# Patient Record
Sex: Male | Born: 1990 | Race: Black or African American | Hispanic: No | Marital: Single | Smoking: Current every day smoker
Health system: Southern US, Community
[De-identification: ages and names within clinical notes are randomized; demographics above are authoritative.]

## PROBLEM LIST (undated history)

## (undated) DIAGNOSIS — K259 Gastric ulcer, unspecified as acute or chronic, without hemorrhage or perforation: Secondary | ICD-10-CM

## (undated) DIAGNOSIS — M5030 Other cervical disc degeneration, unspecified cervical region: Secondary | ICD-10-CM

## (undated) DIAGNOSIS — F909 Attention-deficit hyperactivity disorder, unspecified type: Secondary | ICD-10-CM

## (undated) DIAGNOSIS — M5020 Other cervical disc displacement, unspecified cervical region: Secondary | ICD-10-CM

## (undated) HISTORY — DX: Gastric ulcer, unspecified as acute or chronic, without hemorrhage or perforation: K25.9

---
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
## 2011-09-22 LAB — RAPID STREP SCREEN (MED CTR MEBANE ONLY): Streptococcus, Group A Screen (Direct): NEGATIVE

## 2011-11-25 ENCOUNTER — Encounter: Payer: Self-pay | Admitting: *Deleted

## 2011-11-25 ENCOUNTER — Emergency Department (HOSPITAL_COMMUNITY)
Admission: EM | Admit: 2011-11-25 | Discharge: 2011-11-26 | Disposition: A | Discharge status: Home / Self Care | Payer: Worker's Compensation | Attending: Emergency Medicine | Admitting: Emergency Medicine

## 2011-11-25 ENCOUNTER — Emergency Department (HOSPITAL_COMMUNITY): Payer: Worker's Compensation

## 2011-11-25 DIAGNOSIS — R059 Cough, unspecified: Secondary | ICD-10-CM | POA: Insufficient documentation

## 2011-11-25 DIAGNOSIS — R0602 Shortness of breath: Secondary | ICD-10-CM | POA: Insufficient documentation

## 2011-11-25 DIAGNOSIS — R071 Chest pain on breathing: Secondary | ICD-10-CM | POA: Insufficient documentation

## 2011-11-25 DIAGNOSIS — R05 Cough: Secondary | ICD-10-CM | POA: Insufficient documentation

## 2011-11-25 DIAGNOSIS — R0789 Other chest pain: Secondary | ICD-10-CM

## 2011-11-25 NOTE — ED Notes (Signed)
C/o sickness onset about 2 weeks ago, gradually progressively worse, not getting better, c/o sob, pain in chest, cough (dry/non-productive) & HA. (denies: dizziness,nvd,congestion, sore throat, earaches, facial pain or body aches), mentions occaisional back pain. Pinpoints pain to sternal area, 6/10, fluctuates & varies, worse when he steps out into the cold.poor inspiritory effort, pain worse with inspiration, LS CTA, shallow, CP worse with cough, inspiration, movement and talking.

## 2011-11-26 ENCOUNTER — Other Ambulatory Visit: Payer: Self-pay

## 2011-11-26 MED ORDER — IBUPROFEN 800 MG PO TABS: 800.0000 mg | ORAL_TABLET | Freq: Three times a day (TID) | ORAL | Status: AC

## 2011-11-26 MED ORDER — KETOROLAC TROMETHAMINE 60 MG/2ML IM SOLN
60.0000 mg | Freq: Once | INTRAMUSCULAR | Status: AC
  Administered 2011-11-26: 60 mg via INTRAMUSCULAR
  Filled 2011-11-26: qty 2

## 2011-11-26 NOTE — ED Provider Notes (Signed)
History     CSN: [id number] Arrival date & time: 11/25/2011 10:14 PM   First MD Initiated Contact with Patient 11/26/11 0116      Chief Complaint  Patient presents with  . Shortness of Breath    (Consider location/radiation/quality/duration/timing/severity/associated sxs/prior treatment) Patient is a 20 y.o. male presenting with chest pain. The history is provided by the patient.  Chest Pain The chest pain began 2 days ago. Duration of episode(s) is 2 days. Chest pain occurs frequently. The chest pain is unchanged. The pain is associated with breathing and coughing. The severity of the pain is moderate. The quality of the pain is described as sharp. The pain does not radiate. Chest pain is worsened by certain positions and deep breathing. Pertinent negatives for primary symptoms include no fever, no fatigue, no syncope, no shortness of breath, no wheezing, no palpitations, no abdominal pain, no nausea, no vomiting, no dizziness and no altered mental status.  Pertinent negatives for associated symptoms include no diaphoresis. He tried nothing for the symptoms. Risk factors include no known risk factors.  Pertinent negatives for past medical history include no CAD.    patient had URI symptoms about 2 weeks ago. His cough is now improving and symptoms are improving overall but he has developed substernal sharp chest pain. Hurts take a deep breath hurts to touch the area. No alleviating factors. No history of same. No leg pain or swelling or history of DVT or PE. No recent travel or surgery.  History reviewed. No pertinent past medical history.  History reviewed. No pertinent past surgical history.  Family History  Problem Relation Age of Onset  . Diabetes Other   . Heart failure Other     History  Substance Use Topics  . Smoking status: Current Everyday Smoker    Types: Cigarettes  . Smokeless tobacco: Not on file  . Alcohol Use: No      Review of Systems  Constitutional:  Negative for fever, chills, diaphoresis and fatigue.  HENT: Negative for neck pain and neck stiffness.   Eyes: Negative for pain.  Respiratory: Negative for shortness of breath and wheezing.   Cardiovascular: Positive for chest pain. Negative for palpitations and syncope.  Gastrointestinal: Negative for nausea, vomiting and abdominal pain.  Genitourinary: Negative for dysuria.  Musculoskeletal: Negative for back pain.  Skin: Negative for rash.  Neurological: Negative for dizziness and headaches.  Psychiatric/Behavioral: Negative for altered mental status.  All other systems reviewed and are negative.    Allergies  Review of patient's allergies indicates no known allergies.  Home Medications  No current outpatient prescriptions on file.  BP 121/63  Pulse 78  Temp(Src) 98 F (36.7 C) (Oral)  Resp 20  SpO2 97%  Physical Exam  Constitutional: He is oriented to person, place, and time. He appears well-developed and well-nourished.  HENT:  Head: Normocephalic and atraumatic.  Eyes: Conjunctivae and EOM are normal. Pupils are equal, round, and reactive to light.  Neck: Trachea normal. Neck supple. No thyromegaly present.  Cardiovascular: Normal rate, regular rhythm, S1 normal, S2 normal and normal pulses.     No systolic murmur is present   No diastolic murmur is present  Pulses:      Radial pulses are 2+ on the right side, and 2+ on the left side.  Pulmonary/Chest: Effort normal and breath sounds normal. He has no wheezes. He has no rhonchi. He has no rales.       Reproducible anterior chest wall tenderness over sternum. No  crepitus and no rash.  Abdominal: Soft. Normal appearance and bowel sounds are normal. There is no tenderness. There is no CVA tenderness and negative Murphy's sign.  Musculoskeletal:       BLE:s Calves nontender, no cords or erythema, negative Homans sign  Neurological: He is alert and oriented to person, place, and time. He has normal strength. No cranial  nerve deficit or sensory deficit. GCS eye subscore is 4. GCS verbal subscore is 5. GCS motor subscore is 6.  Skin: Skin is warm and dry. No rash noted. He is not diaphoretic.  Psychiatric: His speech is normal.       Cooperative and appropriate    ED Course  Procedures (including critical care time)  Labs Reviewed - No data to display Dg Chest 2 View  11/25/2011  *RADIOLOGY REPORT*  Clinical Data: Of breath  CHEST - 2 VIEW  Comparison: None.  Findings: The heart, mediastinal, and hilar contours are normal. The lungs are well-expanded and clear. Negative for pleural effusion. The bony thorax is unremarkable.  IMPRESSION: No acute cardiopulmonary disease  Original Report Authenticated By: Britta Mccreedy, M.D.    Date: 11/26/2011  Rate: 62  Rhythm: normal sinus rhythm  QRS Axis: normal  Intervals: normal  ST/T Wave abnormalities: nonspecific ST changes  Conduction Disutrbances:none  Narrative Interpretation:   Old EKG Reviewed: none available    MDM   Chest wall pain reproducible tenderness in the setting of post viral syndrome. Chest x-ray and EKG. Anti-inflammatories provided with IM medication and plan outpatient management of symptoms.        Sunnie Nielsen, MD 11/26/11 [phone]

## 2011-11-26 NOTE — ED Notes (Signed)
Pt to ED c/o increased sob and sternal pain.  He began with a fever 2 weeks prior and some chest congestion.  Since then he has not experienced coughing, but has increasing sob, so much so that he feels he can't finish his sentences.    Presently, pt does not appear in respiratory distress.  Lung sounds clear bil, equal expansion of lungs.

## 2011-11-26 NOTE — ED Notes (Signed)
Pt states pain decreased to 2/10 after ketorolac.

## 2012-02-18 ENCOUNTER — Emergency Department (INDEPENDENT_AMBULATORY_CARE_PROVIDER_SITE_OTHER): Payer: Worker's Compensation

## 2012-02-18 ENCOUNTER — Encounter (HOSPITAL_COMMUNITY): Payer: Self-pay

## 2012-02-18 ENCOUNTER — Emergency Department (INDEPENDENT_AMBULATORY_CARE_PROVIDER_SITE_OTHER)
Admission: EM | Admit: 2012-02-18 | Discharge: 2012-02-18 | Disposition: A | Discharge status: Home / Self Care | Payer: Worker's Compensation | Source: Home / Self Care | Attending: Emergency Medicine | Admitting: Emergency Medicine

## 2012-02-18 DIAGNOSIS — S9030XA Contusion of unspecified foot, initial encounter: Secondary | ICD-10-CM

## 2012-02-18 MED ORDER — IBUPROFEN 800 MG PO TABS: 800.0000 mg | ORAL_TABLET | Freq: Three times a day (TID) | ORAL | Status: AC
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
## 2012-02-18 NOTE — Discharge Instructions (Signed)
Elevate and apply ice intermittently. Follow up as needed. Will be sore for a few days.
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
## 2012-10-23 ENCOUNTER — Encounter (HOSPITAL_COMMUNITY): Payer: Self-pay | Admitting: *Deleted

## 2012-10-23 ENCOUNTER — Emergency Department (HOSPITAL_COMMUNITY)
Admission: EM | Admit: 2012-10-23 | Discharge: 2012-10-23 | Disposition: A | Discharge status: Home / Self Care | Payer: Self-pay | Attending: Emergency Medicine | Admitting: Emergency Medicine

## 2012-10-23 DIAGNOSIS — H109 Unspecified conjunctivitis: Secondary | ICD-10-CM | POA: Insufficient documentation

## 2012-10-23 DIAGNOSIS — H5710 Ocular pain, unspecified eye: Secondary | ICD-10-CM | POA: Insufficient documentation

## 2012-10-23 DIAGNOSIS — F17200 Nicotine dependence, unspecified, uncomplicated: Secondary | ICD-10-CM | POA: Insufficient documentation

## 2012-10-23 MED ORDER — TOBRAMYCIN 0.3 % OP SOLN: 1.0000 [drp] | OPHTHALMIC | Status: DC

## 2012-10-23 NOTE — ED Provider Notes (Signed)
History     CSN: [id number]  Arrival date & time 10/23/12  2120   First MD Initiated Contact with Patient 10/23/12 2135      Chief Complaint  Patient presents with  . Eye Drainage    (Consider location/radiation/quality/duration/timing/severity/associated sxs/prior treatment) HPI Comments: 21 year old male presents to the emergency department complaining of irritation and redness to both of his eyes. The irritation began 3 days ago on the right side, and yesterday the left eye began getting the same symptoms. States he wakes up in the morning with his eyes crusted together. Admits to thick clear discharge. His eyes are not itchy, just irritated. No contacts with similar symptoms. He has not been sick recently. He is not complaining of any other issues at this time. Denies getting any foreign body in his eye or chemical exposure. Denies any visual disturbance.  The history is provided by the patient.    History reviewed. No pertinent past medical history.  History reviewed. No pertinent past surgical history.  Family History  Problem Relation Age of Onset  . Diabetes Other   . Heart failure Other     History  Substance Use Topics  . Smoking status: Current Every Day Smoker    Types: Cigarettes  . Smokeless tobacco: Not on file  . Alcohol Use: No      Review of Systems  Constitutional: Negative for fever and chills.  HENT: Negative for congestion and sneezing.   Eyes: Positive for pain, discharge and redness. Negative for itching and visual disturbance.  Respiratory: Negative for cough and shortness of breath.   Cardiovascular: Negative for chest pain.  Musculoskeletal: Negative for arthralgias.  Neurological: Negative for headaches.    Allergies  Review of patient's allergies indicates no known allergies.  Home Medications   Current Outpatient Rx  Name Route Sig Dispense Refill  . ACETAMINOPHEN 500 MG PO TABS Oral Take 1,000 mg by mouth every 6 (six) hours as  needed. Pain    . IBUPROFEN 200 MG PO TABS Oral Take 400 mg by mouth every 6 (six) hours as needed. Pain    . TOBRAMYCIN SULFATE 0.3 % OP SOLN Both Eyes Place 1 drop into both eyes every 4 (four) hours. 5 mL 0    BP 141/86  Pulse 80  Temp 98.4 F (36.9 C)  Resp 20  SpO2 100%  Physical Exam  Constitutional: He is oriented to person, place, and time. He appears well-developed and well-nourished. No distress.  HENT:  Head: Normocephalic and atraumatic.  Mouth/Throat: Oropharynx is clear and moist. No oropharyngeal exudate.  Eyes: EOM are normal. Pupils are equal, round, and reactive to light. Right eye exhibits discharge (thick yellow). No foreign body present in the right eye. Left eye exhibits discharge (thick yellow). No foreign body present in the left eye. Right conjunctiva is injected. Left conjunctiva is injected.  Neck: Normal range of motion.  Cardiovascular: Normal rate, regular rhythm and normal heart sounds.   Pulmonary/Chest: Effort normal and breath sounds normal.  Musculoskeletal: Normal range of motion. He exhibits no edema.  Lymphadenopathy:    He has no cervical adenopathy.  Neurological: He is alert and oriented to person, place, and time.  Skin: Skin is warm and dry. No rash noted.  Psychiatric: He has a normal mood and affect. His behavior is normal.    ED Course  Procedures (including critical care time)  Labs Reviewed - No data to display No results found.   1. Bacterial conjunctivitis  MDM  Thick yellow discharge consistent with bacterial conjunctivitis. Prescribed Tobrex eye drops. Advised warm compresses. No visual disturbance or foreign body.       Trevor Mace, PA-C 10/23/12 2207

## 2012-10-23 NOTE — ED Notes (Signed)
Pt c/o eyes red, draining, painful x 3 days

## 2012-10-24 NOTE — ED Provider Notes (Signed)
Medical screening examination/treatment/procedure(s) were performed by non-physician practitioner and as supervising physician I was immediately available for consultation/collaboration.   Lyanne Co, MD 10/24/12 [phone]

## 2013-05-23 ENCOUNTER — Emergency Department (HOSPITAL_COMMUNITY)
Admission: EM | Admit: 2013-05-23 | Discharge: 2013-05-23 | Disposition: A | Discharge status: Home / Self Care | Payer: Self-pay | Attending: Emergency Medicine | Admitting: Emergency Medicine

## 2013-05-23 ENCOUNTER — Encounter (HOSPITAL_COMMUNITY): Payer: Self-pay

## 2013-05-23 DIAGNOSIS — R630 Anorexia: Secondary | ICD-10-CM | POA: Insufficient documentation

## 2013-05-23 DIAGNOSIS — R110 Nausea: Secondary | ICD-10-CM | POA: Insufficient documentation

## 2013-05-23 DIAGNOSIS — J029 Acute pharyngitis, unspecified: Secondary | ICD-10-CM | POA: Insufficient documentation

## 2013-05-23 DIAGNOSIS — K2990 Gastroduodenitis, unspecified, without bleeding: Secondary | ICD-10-CM | POA: Insufficient documentation

## 2013-05-23 DIAGNOSIS — F17200 Nicotine dependence, unspecified, uncomplicated: Secondary | ICD-10-CM | POA: Insufficient documentation

## 2013-05-23 DIAGNOSIS — K2970 Gastritis, unspecified, without bleeding: Secondary | ICD-10-CM | POA: Insufficient documentation

## 2013-05-23 LAB — CBC WITH DIFFERENTIAL/PLATELET
Basophils Relative: 0 % (ref 0–1)
HCT: 39.1 % (ref 39.0–52.0)
Hemoglobin: 12.9 g/dL — ABNORMAL LOW (ref 13.0–17.0)
Lymphs Abs: 2.6 10*3/uL (ref 0.7–4.0)
MCH: 26.8 pg (ref 26.0–34.0)
MCHC: 33 g/dL (ref 30.0–36.0)
Monocytes Absolute: 0.9 10*3/uL (ref 0.1–1.0)
Monocytes Relative: 8 % (ref 3–12)
Neutro Abs: 6.6 10*3/uL (ref 1.7–7.7)
Neutrophils Relative %: 65 % (ref 43–77)
RBC: 4.81 MIL/uL (ref 4.22–5.81)

## 2013-05-23 LAB — COMPREHENSIVE METABOLIC PANEL
Alkaline Phosphatase: 69 U/L (ref 39–117)
BUN: 8 mg/dL (ref 6–23)
Chloride: 102 mEq/L (ref 96–112)
Creatinine, Ser: 1 mg/dL (ref 0.50–1.35)
GFR calc Af Amer: >90 mL/min (ref >90)
GFR calc non Af Amer: >90 mL/min (ref >90)
Glucose, Bld: 95 mg/dL (ref 70–99)
Potassium: 3.8 mEq/L (ref 3.5–5.1)
Total Bilirubin: 0.3 mg/dL (ref 0.3–1.2)

## 2013-05-23 MED ORDER — FAMOTIDINE 20 MG PO TABS: 20.0000 mg | ORAL_TABLET | Freq: Two times a day (BID) | ORAL | Status: DC

## 2013-05-23 MED ORDER — PANTOPRAZOLE SODIUM 20 MG PO TBEC: 20.0000 mg | DELAYED_RELEASE_TABLET | Freq: Every day | ORAL | Status: DC

## 2013-05-23 MED ORDER — GI COCKTAIL ~~LOC~~
30.0000 mL | Freq: Once | ORAL | Status: AC
  Administered 2013-05-23: 30 mL via ORAL
  Filled 2013-05-23: qty 30

## 2013-05-23 MED ORDER — PROMETHAZINE HCL 25 MG PO TABS: 25.0000 mg | ORAL_TABLET | Freq: Four times a day (QID) | ORAL | Status: DC | PRN

## 2013-05-23 NOTE — ED Provider Notes (Signed)
Medical screening examination/treatment/procedure(s) were performed by non-physician practitioner and as supervising physician I was immediately available for consultation/collaboration.  Sunnie Nielsen, MD 05/23/13 [phone]

## 2013-05-23 NOTE — ED Provider Notes (Signed)
History     CSN: [id number]  Arrival date & time 05/23/13  0008   First MD Initiated Contact with Patient 05/23/13 0045      Chief Complaint  Patient presents with  . Abdominal Pain   HPI  History provided by the patient. The patient is a 22 year old male with no significant PMH who presents with complaints of abdominal pains discomfort. Patient states symptoms first began 3-4 days ago. Symptoms were intermittent and primarily located in the upper abdomen and epigastric area. Pain occasionally radiates up into the chest. He also reports having some pain and soreness to his throat area. Pain in the throat is worse with swallowing. Denies any associated fever, chills or sweats. No nausea vomiting symptoms. He does report decreased appetite and states he generally has to force himself to eat. No diarrhea or constipation. Denies similar symptoms previously. No other aggravating or alleviating factors. No other associated symptoms.    History reviewed. No pertinent past medical history.  History reviewed. No pertinent past surgical history.  Family History  Problem Relation Age of Onset  . Diabetes Other   . Heart failure Other     History  Substance Use Topics  . Smoking status: Current Every Day Smoker    Types: Cigarettes  . Smokeless tobacco: Not on file  . Alcohol Use: No      Review of Systems  Constitutional: Positive for appetite change.  HENT: Positive for sore throat.   Respiratory: Negative for cough and shortness of breath.   Cardiovascular: Negative for chest pain.  Gastrointestinal: Positive for nausea and abdominal pain.  All other systems reviewed and are negative.    Allergies  Review of patient's allergies indicates no known allergies.  Home Medications  No current outpatient prescriptions on file.  BP 137/64  Pulse 77  Temp(Src) 98.6 F (37 C) (Oral)  Resp 18  SpO2 97%  Physical Exam  Nursing note and vitals reviewed. Constitutional: He is  oriented to person, place, and time. He appears well-developed and well-nourished. No distress.  HENT:  Head: Normocephalic.  Pharynx is erythematous with slightly enlarged tonsils and exudate on the right tonsil. Uvula midline. No signs concerning for PTA  Neck: Normal range of motion.  Cardiovascular: Normal rate and regular rhythm.   Pulmonary/Chest: Effort normal and breath sounds normal. No respiratory distress. He has no wheezes. He has no rales.  Abdominal: Soft. There is tenderness in the epigastric area. There is no rebound, no guarding and no tenderness at McBurney's point.  Primarily upper abdominal discomfort. Mild diffuse tenderness. Abdomen soft. No peritoneal signs.  Musculoskeletal: Normal range of motion.  Lymphadenopathy:    He has no cervical adenopathy.  Neurological: He is alert and oriented to person, place, and time.  Skin: Skin is warm.  Psychiatric: He has a normal mood and affect. His behavior is normal.    ED Course  Procedures   Results for orders placed during the hospital encounter of 05/23/13  RAPID STREP SCREEN      Result Value Range   Streptococcus, Group A Screen (Direct) NEGATIVE  NEGATIVE  CBC WITH DIFFERENTIAL      Result Value Range   WBC 10.2  4.0 - 10.5 K/uL   RBC 4.81  4.22 - 5.81 MIL/uL   Hemoglobin 12.9 (*) 13.0 - 17.0 g/dL   HCT 14.7  82.9 - 56.2 %   MCV 81.3  78.0 - 100.0 fL   MCH 26.8  26.0 - 34.0 pg  MCHC 33.0  30.0 - 36.0 g/dL   RDW 14.7  82.9 - 56.2 %   Platelets 178  150 - 400 K/uL   Neutrophils Relative % 65  43 - 77 %   Neutro Abs 6.6  1.7 - 7.7 K/uL   Lymphocytes Relative 25  12 - 46 %   Lymphs Abs 2.6  0.7 - 4.0 K/uL   Monocytes Relative 8  3 - 12 %   Monocytes Absolute 0.9  0.1 - 1.0 K/uL   Eosinophils Relative 1  0 - 5 %   Eosinophils Absolute 0.1  0.0 - 0.7 K/uL   Basophils Relative 0  0 - 1 %   Basophils Absolute 0.0  0.0 - 0.1 K/uL  COMPREHENSIVE METABOLIC PANEL      Result Value Range   Sodium 137  135 - 145  mEq/L   Potassium 3.8  3.5 - 5.1 mEq/L   Chloride 102  96 - 112 mEq/L   CO2 28  19 - 32 mEq/L   Glucose, Bld 95  70 - 99 mg/dL   BUN 8  6 - 23 mg/dL   Creatinine, Ser 1.30  0.50 - 1.35 mg/dL   Calcium 9.4  8.4 - 86.5 mg/dL   Total Protein 7.2  6.0 - 8.3 g/dL   Albumin 3.6  3.5 - 5.2 g/dL   AST 27  0 - 37 U/L   ALT 26  0 - 53 U/L   Alkaline Phosphatase 69  39 - 117 U/L   Total Bilirubin 0.3  0.3 - 1.2 mg/dL   GFR calc non Af Amer >90  >90 mL/min   GFR calc Af Amer >90  >90 mL/min     1. Pharyngitis   2. Gastritis       MDM  12:45 AM patient seen and evaluated. Patient sitting comfortably in the bed appears in no acute distress or significant discomfort.  Patient initially did not wish to have any pain medications. He now will accept a GI cocktail.  Patient reports improvement of symptoms after GI cocktail. Labs and strep throat test negative. His abdominal exam continues to be benign no concerns for acute abdomen. At this time he is stable for discharge home. Will recommend antiacid medications. Saltwater gargle for sore throat symptoms. Patient to followup or have a recheck if symptoms worsening.      Angus Seller, PA-C 05/23/13 [phone]

## 2013-05-23 NOTE — ED Notes (Signed)
Pt complains of abd pain and a sore throat for one week

## 2013-05-24 LAB — CULTURE, GROUP A STREP

## 2014-03-24 ENCOUNTER — Encounter (HOSPITAL_COMMUNITY): Payer: Self-pay | Admitting: Emergency Medicine

## 2014-03-24 ENCOUNTER — Emergency Department (HOSPITAL_COMMUNITY)
Admission: EM | Admit: 2014-03-24 | Discharge: 2014-03-24 | Disposition: A | Discharge status: Home / Self Care | Payer: Self-pay | Attending: Emergency Medicine | Admitting: Emergency Medicine

## 2014-03-24 DIAGNOSIS — S0993XA Unspecified injury of face, initial encounter: Secondary | ICD-10-CM | POA: Insufficient documentation

## 2014-03-24 DIAGNOSIS — Y9389 Activity, other specified: Secondary | ICD-10-CM | POA: Insufficient documentation

## 2014-03-24 DIAGNOSIS — IMO0002 Reserved for concepts with insufficient information to code with codable children: Secondary | ICD-10-CM | POA: Insufficient documentation

## 2014-03-24 DIAGNOSIS — S199XXA Unspecified injury of neck, initial encounter: Secondary | ICD-10-CM

## 2014-03-24 DIAGNOSIS — F17200 Nicotine dependence, unspecified, uncomplicated: Secondary | ICD-10-CM | POA: Insufficient documentation

## 2014-03-24 DIAGNOSIS — H538 Other visual disturbances: Secondary | ICD-10-CM | POA: Insufficient documentation

## 2014-03-24 DIAGNOSIS — Y92410 Unspecified street and highway as the place of occurrence of the external cause: Secondary | ICD-10-CM | POA: Insufficient documentation

## 2014-03-24 DIAGNOSIS — S0990XA Unspecified injury of head, initial encounter: Secondary | ICD-10-CM | POA: Insufficient documentation

## 2014-03-24 DIAGNOSIS — R519 Headache, unspecified: Secondary | ICD-10-CM

## 2014-03-24 DIAGNOSIS — R51 Headache: Secondary | ICD-10-CM

## 2014-03-24 MED ORDER — TRAMADOL HCL 50 MG PO TABS: 50.0000 mg | ORAL_TABLET | Freq: Four times a day (QID) | ORAL | Status: DC | PRN

## 2014-03-24 MED ORDER — CYCLOBENZAPRINE HCL 10 MG PO TABS: 10.0000 mg | ORAL_TABLET | Freq: Two times a day (BID) | ORAL | Status: DC | PRN

## 2014-03-24 NOTE — ED Notes (Signed)
PA at bedside.
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
## 2014-03-25 NOTE — ED Provider Notes (Signed)
Medical screening examination/treatment/procedure(s) were performed by non-physician practitioner and as supervising physician I was immediately available for consultation/collaboration.   EKG Interpretation None       Montee Tallman R. Lynne Righi, MD 03/25/14 0021 

## 2014-04-06 ENCOUNTER — Encounter (HOSPITAL_COMMUNITY): Payer: Self-pay | Admitting: Emergency Medicine

## 2014-04-06 ENCOUNTER — Emergency Department (HOSPITAL_COMMUNITY)
Admission: EM | Admit: 2014-04-06 | Discharge: 2014-04-06 | Disposition: A | Discharge status: Home / Self Care | Payer: Self-pay | Attending: Emergency Medicine | Admitting: Emergency Medicine

## 2014-04-06 DIAGNOSIS — Z8659 Personal history of other mental and behavioral disorders: Secondary | ICD-10-CM | POA: Insufficient documentation

## 2014-04-06 DIAGNOSIS — G8911 Acute pain due to trauma: Secondary | ICD-10-CM | POA: Insufficient documentation

## 2014-04-06 DIAGNOSIS — S060X9A Concussion with loss of consciousness of unspecified duration, initial encounter: Secondary | ICD-10-CM

## 2014-04-06 DIAGNOSIS — M549 Dorsalgia, unspecified: Secondary | ICD-10-CM

## 2014-04-06 DIAGNOSIS — M542 Cervicalgia: Secondary | ICD-10-CM | POA: Insufficient documentation

## 2014-04-06 DIAGNOSIS — S060XAA Concussion with loss of consciousness status unknown, initial encounter: Secondary | ICD-10-CM

## 2014-04-06 DIAGNOSIS — M545 Low back pain, unspecified: Secondary | ICD-10-CM | POA: Insufficient documentation

## 2014-04-06 DIAGNOSIS — M436 Torticollis: Secondary | ICD-10-CM | POA: Insufficient documentation

## 2014-04-06 DIAGNOSIS — F0781 Postconcussional syndrome: Secondary | ICD-10-CM | POA: Insufficient documentation

## 2014-04-06 DIAGNOSIS — F17200 Nicotine dependence, unspecified, uncomplicated: Secondary | ICD-10-CM | POA: Insufficient documentation

## 2014-04-06 HISTORY — DX: Attention-deficit hyperactivity disorder, unspecified type: F90.9

## 2014-04-06 MED ORDER — METOCLOPRAMIDE HCL 10 MG PO TABS: 10.0000 mg | ORAL_TABLET | Freq: Three times a day (TID) | ORAL | Status: DC | PRN

## 2014-04-06 MED ORDER — ONDANSETRON 4 MG PO TBDP: 4.0000 mg | ORAL_TABLET | Freq: Three times a day (TID) | ORAL | Status: DC | PRN

## 2014-04-06 MED ORDER — METOCLOPRAMIDE HCL 5 MG/ML IJ SOLN
10.0000 mg | Freq: Once | INTRAMUSCULAR | Status: AC
  Administered 2014-04-06: 10 mg via INTRAMUSCULAR
  Filled 2014-04-06: qty 2

## 2014-04-06 MED ORDER — KETOROLAC TROMETHAMINE 30 MG/ML IJ SOLN
30.0000 mg | Freq: Once | INTRAMUSCULAR | Status: AC
  Administered 2014-04-06: 30 mg via INTRAMUSCULAR
  Filled 2014-04-06: qty 1

## 2014-04-06 MED ORDER — DIPHENHYDRAMINE HCL 25 MG PO TABS: 25.0000 mg | ORAL_TABLET | ORAL | Status: DC | PRN

## 2014-04-06 MED ORDER — ONDANSETRON 4 MG PO TBDP
4.0000 mg | ORAL_TABLET | Freq: Once | ORAL | Status: AC
  Administered 2014-04-06: 4 mg via ORAL
  Filled 2014-04-06: qty 1

## 2014-04-06 MED ORDER — DIPHENHYDRAMINE HCL 25 MG PO CAPS
25.0000 mg | ORAL_CAPSULE | Freq: Once | ORAL | Status: AC
  Administered 2014-04-06: 25 mg via ORAL
  Filled 2014-04-06: qty 1

## 2014-04-06 MED ORDER — IBUPROFEN 800 MG PO TABS: 800.0000 mg | ORAL_TABLET | Freq: Three times a day (TID) | ORAL | Status: DC
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
## 2014-04-06 NOTE — Discharge Instructions (Signed)
Call for a follow up appointment with a Family or Primary Care Provider.  Return if Symptoms worsen.   Take medication as prescribed.  If you continue to have a headache and concussion symptoms follow up with Dr. Thad Rangereynolds.

## 2014-04-09 NOTE — ED Provider Notes (Signed)
Medical screening examination/treatment/procedure(s) were performed by non-physician practitioner and as supervising physician I was immediately available for consultation/collaboration.   EKG Interpretation None        Darlen Gledhill Y. Laveah Gloster, MD 04/09/14 0836 

## 2014-04-18 ENCOUNTER — Encounter: Payer: Self-pay | Admitting: Internal Medicine

## 2014-04-18 ENCOUNTER — Ambulatory Visit: Payer: No Typology Code available for payment source | Attending: Internal Medicine | Admitting: Internal Medicine

## 2014-04-18 VITALS — BP 143/94 | HR 79 | Temp 97.8°F | Resp 20 | Ht 69.0 in | Wt 225.0 lb

## 2014-04-18 DIAGNOSIS — F17200 Nicotine dependence, unspecified, uncomplicated: Secondary | ICD-10-CM

## 2014-04-18 DIAGNOSIS — M542 Cervicalgia: Secondary | ICD-10-CM

## 2014-04-18 DIAGNOSIS — M545 Low back pain, unspecified: Secondary | ICD-10-CM

## 2014-04-18 DIAGNOSIS — M79605 Pain in left leg: Secondary | ICD-10-CM

## 2014-04-18 DIAGNOSIS — Z09 Encounter for follow-up examination after completed treatment for conditions other than malignant neoplasm: Secondary | ICD-10-CM | POA: Insufficient documentation

## 2014-04-18 MED ORDER — IBUPROFEN 800 MG PO TABS: 800.0000 mg | ORAL_TABLET | Freq: Two times a day (BID) | ORAL | Status: DC

## 2014-04-18 MED ORDER — CYCLOBENZAPRINE HCL 10 MG PO TABS: 10.0000 mg | ORAL_TABLET | Freq: Two times a day (BID) | ORAL | Status: DC | PRN

## 2014-04-18 NOTE — Patient Instructions (Addendum)
Back Pain, Adult Low back pain is very common. About 1 in 5 people have back pain.The cause of low back pain is rarely dangerous. The pain often gets better over time.About half of people with a sudden onset of back pain feel better in just 2 weeks. About 8 in 10 people feel better by 6 weeks.  CAUSES Some common causes of back pain include:  Strain of the muscles or ligaments supporting the spine.  Wear and tear (degeneration) of the spinal discs.  Arthritis.  Direct injury to the back. DIAGNOSIS Most of the time, the direct cause of low back pain is not known.However, back pain can be treated effectively even when the exact cause of the pain is unknown.Answering your caregiver's questions about your overall health and symptoms is one of the most accurate ways to make sure the cause of your pain is not dangerous. If your caregiver needs more information, he or she may order lab work or imaging tests (X-rays or MRIs).However, even if imaging tests show changes in your back, this usually does not require surgery. HOME CARE INSTRUCTIONS For many people, back pain returns.Since low back pain is rarely dangerous, it is often a condition that people can learn to manageon their own.   Remain active. It is stressful on the back to sit or stand in one place. Do not sit, drive, or stand in one place for more than 30 minutes at a time. Take short walks on level surfaces as soon as pain allows.Try to increase the length of time you walk each day.  Do not stay in bed.Resting more than 1 or 2 days can delay your recovery.  Do not avoid exercise or work.Your body is made to move.It is not dangerous to be active, even though your back may hurt.Your back will likely heal faster if you return to being active before your pain is gone.  Pay attention to your body when you bend and lift. Many people have less discomfortwhen lifting if they bend their knees, keep the load close to their bodies,and  avoid twisting. Often, the most comfortable positions are those that put less stress on your recovering back.  Find a comfortable position to sleep. Use a firm mattress and lie on your side with your knees slightly bent. If you lie on your back, put a pillow under your knees.  Only take over-the-counter or prescription medicines as directed by your caregiver. Over-the-counter medicines to reduce pain and inflammation are often the most helpful.Your caregiver may prescribe muscle relaxant drugs.These medicines help dull your pain so you can more quickly return to your normal activities and healthy exercise.  Put ice on the injured area.  Put ice in a plastic bag.  Place a towel between your skin and the bag.  Leave the ice on for 15-20 minutes, 03-04 times a day for the first 2 to 3 days. After that, ice and heat may be alternated to reduce pain and spasms.  Ask your caregiver about trying back exercises and gentle massage. This may be of some benefit.  Avoid feeling anxious or stressed.Stress increases muscle tension and can worsen back pain.It is important to recognize when you are anxious or stressed and learn ways to manage it.Exercise is a great option. SEEK MEDICAL CARE IF:  You have pain that is not relieved with rest or medicine.  You have pain that does not improve in 1 week.  You have new symptoms.  You are generally not feeling well. SEEK   IMMEDIATE MEDICAL CARE IF:   You have pain that radiates from your back into your legs.  You develop new bowel or bladder control problems.  You have unusual weakness or numbness in your arms or legs.  You develop nausea or vomiting.  You develop abdominal pain.  You feel faint. Document Released: 12/12/2005 Document Revised: 06/12/2012 Document Reviewed: 05/02/2011 Exit[employer] Patient Information 2014 [employer].   Smoking Cessation Quitting smoking is important to your health and has many advantages. However, it is not  always easy to quit since nicotine is a very addictive drug. Often times, people try 3 times or more before being able to quit. This document explains the best ways for you to prepare to quit smoking. Quitting takes hard work and a lot of effort, but you can do it. ADVANTAGES OF QUITTING SMOKING  You will live longer, feel better, and live better.  Your body will feel the impact of quitting smoking almost immediately.  Within 20 minutes, blood pressure decreases. Your pulse returns to its normal level.  After 8 hours, carbon monoxide levels in the blood return to normal. Your oxygen level increases.  After 24 hours, the chance of having a heart attack starts to decrease. Your breath, hair, and body stop smelling like smoke.  After 48 hours, damaged nerve endings begin to recover. Your sense of taste and smell improve.  After 72 hours, the body is virtually free of nicotine. Your bronchial tubes relax and breathing becomes easier.  After 2 to 12 weeks, lungs can hold more air. Exercise becomes easier and circulation improves.  The risk of having a heart attack, stroke, cancer, or lung disease is greatly reduced.  After 1 year, the risk of coronary heart disease is cut in half.  After 5 years, the risk of stroke falls to the same as a nonsmoker.  After 10 years, the risk of lung cancer is cut in half and the risk of other cancers decreases significantly.  After 15 years, the risk of coronary heart disease drops, usually to the level of a nonsmoker.  If you are pregnant, quitting smoking will improve your chances of having a healthy baby.  The people you live with, especially any children, will be healthier.  You will have extra money to spend on things other than cigarettes. QUESTIONS TO THINK ABOUT BEFORE ATTEMPTING TO QUIT You may want to talk about your answers with your caregiver.  Why do you want to quit?  If you tried to quit in the past, what helped and what did  not?  What will be the most difficult situations for you after you quit? How will you plan to handle them?  Who can help you through the tough times? Your family? Friends? A caregiver?  What pleasures do you get from smoking? What ways can you still get pleasure if you quit? Here are some questions to ask your caregiver:  How can you help me to be successful at quitting?  What medicine do you think would be best for me and how should I take it?  What should I do if I need more help?  What is smoking withdrawal like? How can I get information on withdrawal? GET READY  Set a quit date.  Change your environment by getting rid of all cigarettes, ashtrays, matches, and lighters in your home, car, or work. Do not let people smoke in your home.  Review your past attempts to quit. Think about what worked and what did not.   GET SUPPORT AND ENCOURAGEMENT You have a better chance of being successful if you have help. You can get support in many ways.  Tell your family, friends, and co-workers that you are going to quit and need their support. Ask them not to smoke around you.  Get individual, group, or telephone counseling and support. Programs are available at local hospitals and health centers. Call your local health department for information about programs in your area.  Spiritual beliefs and practices may help some smokers quit.  Download a "quit meter" on your computer to keep track of quit statistics, such as how long you have gone without smoking, cigarettes not smoked, and money saved.  Get a self-help book about quitting smoking and staying off of tobacco. LEARN NEW SKILLS AND BEHAVIORS  Distract yourself from urges to smoke. Talk to someone, go for a walk, or occupy your time with a task.  Change your normal routine. Take a different route to work. Drink tea instead of coffee. Eat breakfast in a different place.  Reduce your stress. Take a hot bath, exercise, or read a  book.  Plan something enjoyable to do every day. Reward yourself for not smoking.  Explore interactive web-based programs that specialize in helping you quit. GET MEDICINE AND USE IT CORRECTLY Medicines can help you stop smoking and decrease the urge to smoke. Combining medicine with the above behavioral methods and support can greatly increase your chances of successfully quitting smoking.  Nicotine replacement therapy helps deliver nicotine to your body without the negative effects and risks of smoking. Nicotine replacement therapy includes nicotine gum, lozenges, inhalers, nasal sprays, and skin patches. Some may be available over-the-counter and others require a prescription.  Antidepressant medicine helps people abstain from smoking, but how this works is unknown. This medicine is available by prescription.  Nicotinic receptor partial agonist medicine simulates the effect of nicotine in your brain. This medicine is available by prescription. Ask your caregiver for advice about which medicines to use and how to use them based on your health history. Your caregiver will tell you what side effects to look out for if you choose to be on a medicine or therapy. Carefully read the information on the package. Do not use any other product containing nicotine while using a nicotine replacement product.  RELAPSE OR DIFFICULT SITUATIONS Most relapses occur within the first 3 months after quitting. Do not be discouraged if you start smoking again. Remember, most people try several times before finally quitting. You may have symptoms of withdrawal because your body is used to nicotine. You may crave cigarettes, be irritable, feel very hungry, cough often, get headaches, or have difficulty concentrating. The withdrawal symptoms are only temporary. They are strongest when you first quit, but they will go away within 10 14 days. To reduce the chances of relapse, try to:  Avoid drinking alcohol. Drinking lowers  your chances of successfully quitting.  Reduce the amount of caffeine you consume. Once you quit smoking, the amount of caffeine in your body increases and can give you symptoms, such as a rapid heartbeat, sweating, and anxiety.  Avoid smokers because they can make you want to smoke.  Do not let weight gain distract you. Many smokers will gain weight when they quit, usually less than 10 pounds. Eat a healthy diet and stay active. You can always lose the weight gained after you quit.  Find ways to improve your mood other than smoking. FOR MORE INFORMATION  www.smokefree.gov  Document Released:   12/06/2001 Document Revised: 06/12/2012 Document Reviewed: 03/22/2012 [employer] Patient Information 2014 [employer].  
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
## 2014-04-25 ENCOUNTER — Ambulatory Visit: Payer: No Typology Code available for payment source | Attending: Internal Medicine

## 2014-05-01 ENCOUNTER — Ambulatory Visit (HOSPITAL_COMMUNITY): Payer: No Typology Code available for payment source

## 2014-05-01 ENCOUNTER — Ambulatory Visit (HOSPITAL_COMMUNITY)
Admission: RE | Admit: 2014-05-01 | Discharge: 2014-05-01 | Disposition: A | Discharge status: Home / Self Care | Payer: No Typology Code available for payment source | Source: Ambulatory Visit | Attending: Internal Medicine | Admitting: Internal Medicine

## 2014-05-01 DIAGNOSIS — R51 Headache: Secondary | ICD-10-CM | POA: Insufficient documentation

## 2014-05-01 DIAGNOSIS — M542 Cervicalgia: Secondary | ICD-10-CM | POA: Insufficient documentation

## 2014-05-01 DIAGNOSIS — M545 Low back pain, unspecified: Secondary | ICD-10-CM | POA: Insufficient documentation

## 2014-05-01 DIAGNOSIS — M79605 Pain in left leg: Secondary | ICD-10-CM

## 2014-05-05 ENCOUNTER — Telehealth: Payer: Self-pay | Admitting: Emergency Medicine

## 2014-05-05 NOTE — Telephone Encounter (Signed)
Pt given MRI results. States he has started new job with noted increased pain in shoulder blade radiating down back. States prescribed Ibuprofen and Flexeril not working for sx's.

## 2014-05-05 NOTE — Telephone Encounter (Signed)
Left message for pt to call clinic for lab results 

## 2014-05-05 NOTE — Telephone Encounter (Signed)
Message copied by Darlis LoanSMITH, JILL D on Mon May 05, 2014  4:09 PM ------      Message from: Holland CommonsKECK, VALERIE A      Created: Mon May 05, 2014  9:44 AM       Let patient know the MRI only showed slight disc protrusion. Ask if symptoms have resolved with last treatment prescribed at office visit? ------

## 2014-05-13 NOTE — Telephone Encounter (Signed)
You may refill last medication given to patient in office visit. If medication is not working patient can have referral for sport med for further treatment. Thanks.

## 2014-10-14 ENCOUNTER — Encounter (HOSPITAL_COMMUNITY): Payer: Self-pay | Admitting: Emergency Medicine

## 2014-10-14 ENCOUNTER — Emergency Department (HOSPITAL_COMMUNITY)
Admission: EM | Admit: 2014-10-14 | Discharge: 2014-10-14 | Disposition: A | Discharge status: Home / Self Care | Payer: No Typology Code available for payment source | Attending: Emergency Medicine | Admitting: Emergency Medicine

## 2014-10-14 DIAGNOSIS — B354 Tinea corporis: Secondary | ICD-10-CM | POA: Insufficient documentation

## 2014-10-14 DIAGNOSIS — Z8659 Personal history of other mental and behavioral disorders: Secondary | ICD-10-CM | POA: Insufficient documentation

## 2014-10-14 DIAGNOSIS — Z720 Tobacco use: Secondary | ICD-10-CM | POA: Insufficient documentation

## 2014-10-14 MED ORDER — TERBINAFINE HCL 1 % EX CREA: 1.0000 "application " | TOPICAL_CREAM | Freq: Two times a day (BID) | CUTANEOUS | Status: DC

## 2014-10-14 NOTE — ED Provider Notes (Signed)
CSN: [id number]     Arrival date & time 10/14/14  1516 History  This chart was scribed for Lottie Musselatyana A Chrissie Dacquisto, PA with Richardean Canalavid H Yao, MD by Tonye RoyaltyJoshua Chen, ED Scribe. This patient was seen in room WTR9/WTR9 and the patient's care was started at 4:52 PM.    Chief Complaint  Patient presents with  . Rash   The history is provided by the patient. No language interpreter was used.   HPI Comments: Isaac Olson is a 23 y.o. male who presents to the Emergency Department complaining of a itching rash to his right forearm with onset 1 month ago. He states he thought it was ringworm so he has been using Lotrimin cream since they began. He reports sudden onset of similar rashes to his left forearm and above his left elbow today.   Past Medical History  Diagnosis Date  . ADHD (attention deficit hyperactivity disorder)    History reviewed. No pertinent past surgical history. Family History  Problem Relation Age of Onset  . Diabetes Other   . Heart failure Other   . Diabetes Maternal Grandmother    History  Substance Use Topics  . Smoking status: Current Every Day Smoker    Types: Cigarettes  . Smokeless tobacco: Not on file  . Alcohol Use: No    Review of Systems  Constitutional: Negative for fever and chills.  Skin: Positive for rash.      Allergies  Review of patient's allergies indicates no known allergies.  Home Medications   Prior to Admission medications   Not on File   BP 133/74  Pulse 71  Temp(Src) 98.5 F (36.9 C) (Oral)  Resp 18  Wt 211 lb (95.709 kg)  SpO2 100% Physical Exam  Nursing note and vitals reviewed. Constitutional: He is oriented to person, place, and time. He appears well-developed and well-nourished.  HENT:  Head: Normocephalic and atraumatic.  Eyes: Conjunctivae are normal.  Neck: Normal range of motion. Neck supple.  Pulmonary/Chest: Effort normal.  Musculoskeletal: Normal range of motion.  Neurological: He is alert and oriented to person,  place, and time.  Skin: Skin is warm and dry.  4x4cm raised erythematous round area of skin with central papules to the anterior right distal forearm. Another patch area of similar rash to the left bicep. Non tender.   Psychiatric: He has a normal mood and affect.    ED Course  Procedures (including critical care time) Labs Review Labs Reviewed - No data to display  Imaging Review No results found.   EKG Interpretation None     DIAGNOSTIC STUDIES: Oxygen Saturation is 10% on room air, normal by my interpretation.    COORDINATION OF CARE: 4:57 PM Discussed treatment plan with patient at beside, including prescription for a different antifungal cream with instructions for use. Also discussed with him the possibility of using an antifungal pill if necessary. The patient agrees with the plan and has no further questions at this time.   MDM   Final diagnoses:  Tinea corporis    Patient with rash suspicious  14" porus. He has been applying Lotrimin cream with no improvement. Will switch to Lamisil, followup with primary care Dr. Effie BerkshireExplained that if is not improving or worsening he may need oral medications but instructed him to followup with his doctor to have this prescribed.  Filed Vitals:   10/14/14 1551  BP: 133/74  Pulse: 71  Temp: 98.5 F (36.9 C)  TempSrc: Oral  Resp: 18  Weight: 211  lb (95.709 kg)  SpO2: 100%   I personally performed the services described in this documentation, which was scribed in my presence. The recorded information has been reviewed and is accurate.    Lottie Musselatyana A Ife Vitelli, PA-C 10/14/14 1704

## 2014-10-14 NOTE — ED Notes (Addendum)
Pt reports dark itchy rash that came up x 1 month ago.  Thinks it's ring worm so he's been using anitfungal creams without relief.

## 2014-10-14 NOTE — ED Provider Notes (Signed)
Medical screening examination/treatment/procedure(s) were performed by non-physician practitioner and as supervising physician I was immediately available for consultation/collaboration.   EKG Interpretation None        David H Yao, MD 10/14/14 2334 

## 2014-10-14 NOTE — Discharge Instructions (Signed)
Apply lamisil twice a day every day to the rash areas. Follow up with your doctor if not improving. You can also try hydrocortisone cream for itching.    Body Ringworm Ringworm (tinea corporis) is a fungal infection of the skin on the body. This infection is not caused by worms, but is actually caused by a fungus. Fungus normally lives on the top of your skin and can be useful. However, in the case of ringworms, the fungus grows out of control and causes a skin infection. It can involve any area of skin on the body and can spread easily from one person to another (contagious). Ringworm is a common problem for children, but it can affect adults as well. Ringworm is also often found in athletes, especially wrestlers who share equipment and mats.  CAUSES  Ringworm of the body is caused by a fungus called dermatophyte. It can spread by:  Touchingother people who are infected.  Touchinginfected pets.  Touching or sharingobjects that have been in contact with the infected person or pet (hats, combs, towels, clothing, sports equipment). SYMPTOMS   Itchy, raised red spots and bumps on the skin.  Ring-shaped rash.  Redness near the border of the rash with a clear center.  Dry and scaly skin on or around the rash. Not every person develops a ring-shaped rash. Some develop only the red, scaly patches. DIAGNOSIS  Most often, ringworm can be diagnosed by performing a skin exam. Your caregiver may choose to take a skin scraping from the affected area. The sample will be examined under the microscope to see if the fungus is present.  TREATMENT  Body ringworm may be treated with a topical antifungal cream or ointment. Sometimes, an antifungal shampoo that can be used on your body is prescribed. You may be prescribed antifungal medicines to take by mouth if your ringworm is severe, keeps coming back, or lasts a long time.  HOME CARE INSTRUCTIONS   Only take over-the-counter or prescription medicines  as directed by your caregiver.  Wash the infected area and dry it completely before applying yourcream or ointment.  When using antifungal shampoo to treat the ringworm, leave the shampoo on the body for 3-5 minutes before rinsing.   Wear loose clothing to stop clothes from rubbing and irritating the rash.  Wash or change your bed sheets every night while you have the rash.  Have your pet treated by your veterinarian if it has the same infection. To prevent ringworm:   Practice good hygiene.  Wear sandals or shoes in public places and showers.  Do not share personal items with others.  Avoid touching red patches of skin on other people.  Avoid touching pets that have bald spots or wash your hands after doing so. SEEK MEDICAL CARE IF:   Your rash continues to spread after 7 days of treatment.  Your rash is not gone in 4 weeks.  The area around your rash becomes red, warm, tender, and swollen. Document Released: 12/09/2000 Document Revised: 09/05/2012 Document Reviewed: 06/25/2012 [facility] Patient Information 2015 [place], [place]. This information is not intended to replace advice given to you by your health care provider. Make sure you discuss any questions you have with your health care provider.

## 2015-01-23 ENCOUNTER — Encounter (HOSPITAL_COMMUNITY): Payer: Self-pay | Admitting: *Deleted

## 2015-01-23 ENCOUNTER — Emergency Department (HOSPITAL_COMMUNITY)
Admission: EM | Admit: 2015-01-23 | Discharge: 2015-01-23 | Disposition: A | Discharge status: Home / Self Care | Payer: No Typology Code available for payment source | Attending: Emergency Medicine | Admitting: Emergency Medicine

## 2015-01-23 DIAGNOSIS — Z720 Tobacco use: Secondary | ICD-10-CM | POA: Insufficient documentation

## 2015-01-23 DIAGNOSIS — Z8739 Personal history of other diseases of the musculoskeletal system and connective tissue: Secondary | ICD-10-CM | POA: Insufficient documentation

## 2015-01-23 DIAGNOSIS — T391X1A Poisoning by 4-Aminophenol derivatives, accidental (unintentional), initial encounter: Secondary | ICD-10-CM | POA: Insufficient documentation

## 2015-01-23 DIAGNOSIS — Z79899 Other long term (current) drug therapy: Secondary | ICD-10-CM | POA: Insufficient documentation

## 2015-01-23 DIAGNOSIS — Z8659 Personal history of other mental and behavioral disorders: Secondary | ICD-10-CM | POA: Insufficient documentation

## 2015-01-23 DIAGNOSIS — R21 Rash and other nonspecific skin eruption: Secondary | ICD-10-CM | POA: Insufficient documentation

## 2015-01-23 HISTORY — DX: Other cervical disc displacement, unspecified cervical region: M50.20

## 2015-01-23 HISTORY — DX: Other cervical disc degeneration, unspecified cervical region: M50.30

## 2015-01-23 LAB — COMPREHENSIVE METABOLIC PANEL
ALBUMIN: 4.4 g/dL (ref 3.5–5.2)
ALT: 12 U/L (ref 0–53)
AST: 22 U/L (ref 0–37)
Alkaline Phosphatase: 63 U/L (ref 39–117)
Anion gap: 3 — ABNORMAL LOW (ref 5–15)
BILIRUBIN TOTAL: 0.5 mg/dL (ref 0.3–1.2)
BUN: 9 mg/dL (ref 6–23)
CO2: 26 mmol/L (ref 19–32)
Calcium: 9.3 mg/dL (ref 8.4–10.5)
Chloride: 109 mmol/L (ref 96–112)
Creatinine, Ser: 0.88 mg/dL (ref 0.50–1.35)
GFR calc non Af Amer: >90 mL/min (ref >90)
Glucose, Bld: 90 mg/dL (ref 70–99)
Potassium: 3.9 mmol/L (ref 3.5–5.1)
SODIUM: 138 mmol/L (ref 135–145)
TOTAL PROTEIN: 7.6 g/dL (ref 6.0–8.3)

## 2015-01-23 LAB — ACETAMINOPHEN LEVEL: ACETAMINOPHEN (TYLENOL), SERUM: 19 ug/mL (ref 10–30)

## 2015-01-23 LAB — SALICYLATE LEVEL: Salicylate Lvl: <4 mg/dL (ref 2.8–20.0)

## 2015-01-23 MED ORDER — DIPHENHYDRAMINE HCL 50 MG/ML IJ SOLN
25.0000 mg | Freq: Once | INTRAMUSCULAR | Status: AC
  Administered 2015-01-23: 25 mg via INTRAVENOUS
  Filled 2015-01-23: qty 1

## 2015-01-23 MED ORDER — SODIUM CHLORIDE 0.9 % IV BOLUS (SEPSIS)
1000.0000 mL | Freq: Once | INTRAVENOUS | Status: AC
  Administered 2015-01-23: 1000 mL via INTRAVENOUS

## 2015-01-23 MED ORDER — MAGNESIUM SULFATE 2 GM/50ML IV SOLN
2.0000 g | Freq: Once | INTRAVENOUS | Status: AC
  Administered 2015-01-23: 2 g via INTRAVENOUS
  Filled 2015-01-23: qty 50

## 2015-01-23 MED ORDER — METOCLOPRAMIDE HCL 5 MG/ML IJ SOLN
10.0000 mg | Freq: Once | INTRAMUSCULAR | Status: AC
  Administered 2015-01-23: 10 mg via INTRAVENOUS
  Filled 2015-01-23: qty 2

## 2015-01-23 NOTE — Discharge Instructions (Signed)
Do not hesitate to return to the emergency room for any new, worsening or concerning symptoms. ° °Please obtain primary care using resource guide below. But the minute you were seen in the emergency room and that they will need to obtain records for further outpatient management. ° ° ° °Emergency Department Resource Guide °1) Find a Doctor and Pay Out of Pocket °Although you won't have to find out who is covered by your insurance plan, it is a good idea to ask around and get recommendations. You will then need to call the office and see if the doctor you have chosen will accept you as a new patient and what types of options they offer for patients who are self-pay. Some doctors offer discounts or will set up payment plans for their patients who do not have insurance, but you will need to ask so you aren't surprised when you get to your appointment. ° °2) Contact Your Local Health Department °Not all health departments have doctors that can see patients for sick visits, but many do, so it is worth a call to see if yours does. If you don't know where your local health department is, you can check in your phone book. The CDC also has a tool to help you locate your state's health department, and many state websites also have listings of all of their local health departments. ° °3) Find a Walk-in Clinic °If your illness is not likely to be very severe or complicated, you may want to try a walk in clinic. These are popping up all over the country in pharmacies, drugstores, and shopping centers. They're usually staffed by nurse practitioners or physician assistants that have been trained to treat common illnesses and complaints. They're usually fairly quick and inexpensive. However, if you have serious medical issues or chronic medical problems, these are probably not your best option. ° °No Primary Care Doctor: °- Call Health Connect at  832-8000 - they can help you locate a primary care doctor that  accepts your  insurance, provides certain services, etc. °- Physician Referral Service- 1-800-533-3463 ° °Chronic Pain Problems: °Organization         Address  Phone   Notes  °[facility]  (336) 297-2271 Patients need to be referred by their primary care doctor.  ° °Medication Assistance: °Organization         Address  Phone   Notes  °[place] [facility] [address] °[place], [place] [address] (336) 641-8030 --Must be a resident of [place] °-- Must have NO insurance coverage whatsoever (no Medicaid/ Medicare, etc.) °-- The pt. MUST have a primary care doctor that directs their care regularly and follows them in the community °  °[facility]  (866) 331-1348   °[employer] Way  (888) 892-1162   ° °Agencies that provide inexpensive medical care: °Organization         Address  Phone   Notes  °[facility]  (336) 832-8035   °[facility]    (336) 832-7272   °[facility] [address] °[place], [place] [address] (336) 832-4777   °[facility] [address], °[place] (336) 271-4999   °[facility]    (336) 373-0678   °[facility]    (336) 272-1050   °[facility] ° [address], [place] Phone:  (336) 832-4444, Fax:  (336) 832-4440 Hours of Operation:  9 am -   6 pm, M-F.  Also accepts Medicaid/Medicare and self-pay.  °[facility] ° [address], [place] Phone: (336) 832-3150, Fax: (336) 832-3151. Hours of Operation:  8:30 am - 5:30 pm, M-F.  Also accepts Medicaid and self-pay.  °[facility] [place] [address], [place] Phone: (336) 878-6027   °[facility] [address], [place], [place] (336)723-1848, Ext. 123 Mondays & Thursdays: 7-9 AM.  First 15 patients are seen on a first come, first serve basis. °  ° °Medicaid-accepting [place] Providers: ° °Organization          Address  Phone   Notes  °[facility] [address], [place] (336) 641-2100 Also accepts self-pay patients.  °[facility] [address], [place] ° (336) 856-9996   °[facility] [address], [place] (336) 288-8857   °[facility] [address] [place] [address], [place] (336) 299-7000   °Veita Bland [address], [place]  ° (336) 373-1557 Only accepts [place] Access Medicaid patients after they have their name applied to their card.  ° °Self-Pay (no insurance) in [place]: ° °Organization         Address  Phone   Notes  °Sickle Cell Patients, [facility] [address], [place] (336) 832-1970   °[facility] [address], [place] (336) 832-4400   °[facility] ° [address], [place] (336) 992-4800   °[facility]/Dr. Osei-Bonsu ° [address] [place] [address], [place] or [address], [place] (336) 841-8500 Phone number for both [place] and [place] locations is the same.  °[facility] [address], [place] (336) 299-0000   °[facility] [address] [place] [address], [place] or [address] (336) 852-7530 °(336) 878-2260   °[facility] [address], [place] (336) 350-1642, phone; (336) 294-5005, fax Sees patients 1st and 3rd Saturday of every month.  Must not qualify for public or private insurance (i.e. Medicaid, Medicare, [place] Health Choice, Veterans' Benefits) • Household income should be no more than 200% of the poverty level •The clinic cannot treat you if you are pregnant or think you are pregnant • Sexually transmitted diseases are not treated at the clinic.  ° ° °Dental Care: °Organization         Address  Phone  Notes  °[facility] [address],  [place] (336) 641-6152 Accepts children up to age 21 who are enrolled in Medicaid or [place] Health Choice; pregnant women with a Medicaid card; and children who have applied for Medicaid or [place] Health Choice, but were declined, whose parents can pay a reduced fee at time of service.  °[facility] [place]  [address], [place] (336) 641-7733 Accepts children up to age 21 who are enrolled in Medicaid or [place] Health Choice; pregnant women with a Medicaid card; and children who have applied for Medicaid or [place] Health Choice, but were declined, whose parents can pay a reduced fee at time of service.  °[facility] ° [address], [place] (336) 641-4533 Patients are seen by appointment only. Walk-ins are not accepted. [facility] will see patients 18 years of age and   older. °Monday - Tuesday (8am-5pm) °Most Wednesdays (8:30-5pm) °$30 per visit, cash only  °[facility] ° [address], [place] (336) 641-4533 Patients are seen by appointment only. Walk-ins are not accepted. [facility] will see patients 18 years of age and older. °One Wednesday Evening (Monthly: Volunteer Based).  $30 per visit, cash only  °[facility]  (919) 537-3737 for adults; Children under age 4, call Graduate Pediatric Dentistry at (919) [phone]. Children aged 4-14, please call (919) [phone] to request a pediatric application. ° Dental services are provided in all areas of dental care including fillings, crowns and bridges, complete and partial dentures, implants, gum treatment, root canals, and extractions. Preventive care is also provided. Treatment is provided to both adults and children. °Patients are selected via a lottery and there is often a waiting list. °  °[facility] [address], °[place] ° (336) 763-8833 www.drcivils.com °  °[facility] [address], [place], [place]  (336)723-1848, Ext. 123 Second and Fourth Thursday of each month, opens at 6:30 AM; Clinic ends at 9 AM.  Patients are seen on a first-come first-served basis, and a limited number are seen during each clinic.  ° °[facility] ° [address], [place], [place] (336) 723-7904   Eligibility Requirements °You must have lived in [place], [place], or [place] counties for at least the last three months. °  You cannot be eligible for state or federal sponsored healthcare insurance, including Veterans Administration, Medicaid, or Medicare. °  You generally cannot be eligible for healthcare insurance through your employer.  °  How to apply: °Eligibility screenings are held every Tuesday and Wednesday afternoon from 1:00 pm until 4:00 pm. You do not need an appointment for the interview!  °[facility] [address], [place], [place] 336-631-2330   °[facility]  336-342-8273   °[facility]  336-703-3100   °[facility]  336-570-6415   ° °Behavioral Health Resources in the Community: °Intensive Outpatient Programs °Organization         Address  Phone  Notes  °[facility] [address], [place], [place] 336-878-6098   °[facility] [address], [place], [place] 336-832-9800   °[facility] [address], [place], [place] ° 336-882-2125   °[facility] [address],  °[place], [place] 1-800-853-5163 or 336-641-4981   °Substance Abuse Resources °Organization         Address  Phone  Notes  °[facility]  336-882-2125   °[facility]  336-784-9470   °[facility]  336-285-9073   °[facility]  336-845-3988   °Residential & Outpatient Substance Abuse Program  1-800-659-3381   °Psychological Services °Organization         Address  Phone  Notes  °[facility]  336- 832-9600   °[facility]  336- 378-7881    °[facility] [address], [place] 1-800-853-5163 or 336-641-4981   ° °Mobile Crisis Teams °Organization         Address  Phone  Notes  °[facility], Mobile Crisis Care Unit  1-877-626-1772   °Assertive °[facility] ° [address] [place], [place] 336-834-9664   °Sharon DeEsch [address] °[place] 336-554-5454   ° °Self-Help/Support Groups °Organization         Address    Phone             Notes  °[facility] - variety of support groups  336- 373-1402 Call for more information  °Narcotics Anonymous (NA), [facility] [address], °[place]  2 meetings at this location  ° °Residential Treatment Programs °Organization         Address  Phone  Notes  °[facility] [address],    °[place]  1-866-801-8205   °[facility] ° [address], [place], [place] 704-293-8524   °[facility] [address], [place] 336-845-3988 Admissions: 8am-3pm M-F  °[facility] [address]    °[place], [place] 336-841-1104   °[facility] [address], [place], [place] 336-379-7146   °[facility] [address]  °[place], [place] 336-285-9073   °[facility] Intensive Outpatient [address], [place], [place] 336-852-3033   °[facility] [address]  °[place], [place] 1-877-615-2722 or 336-784-9470   °[facility] ([facility] [address] [place], [place] 336-227-7417 Accepts Medicaid  °[facility] [address]  °[place] 1-800-659-3381 Substance Abuse/Addiction Treatment  ° °[place] Behavioral Health Resources °Organization         Address  Phone  Notes  °[facility]  (888) 581-9988   °Julie Brannon, PhD [address] [place], [place]   (336) 349-5553 or (336) 951-0000   °[facility]   [address] °[place], [place] (336) 349-4454   °[facility] [address], [place], [place] (336) 342-8316 Insurance/Medicaid/sponsorship through [facility]  °[facility] [address]                                    [place], [place] (336) 342-8316 Therapy/tele-psych/case  °[facility] [address]  ° [place], [place] (336) 349-2233    °Dr. Arfeen  (336) 349-4544   °[facility] [place]  [employer] [place] [facility] 1) [address],  °2) [address], [place] °3)  [address], [place] (336) 349-3220 °(336) 342-7768 ° °(336) 342-8140   °[place] [facility] (336) 342-1394 or (336) 342-3537 (After Hours)    ° ° ° °
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
## 2015-01-23 NOTE — Progress Notes (Signed)
  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 Would like this checked, this is his main     Subjective/Objective Assessment Detail:   Sts it never cleared up, only worsened. Would like this checked, this is his main concern. But also wants to be evaluated for HA x1 month 2/2 bulging disc. Tylenol no longer controlling the pain.         Pt reports he was seen by a "specialist" but released through "work comp"              Action/Plan:   Spoke with pt about need for pcp for f/u on ongoing medical issues and to check with his work comp representative to see if he is still eligible to see him   Action/Plan Detail:   Anticipated DC Date:  01/23/2015     Status Recommendation to Physician:   Result of Recommendation:    Other ED Services  Consult Working Plan    DC [facility] [employer]  Other  Outpatient Services - Pt will follow up  PCP issues  [facility] / [facility] (established/new)    Choice offered to / List presented to:            Status of service:  Completed, signed off  ED Comments:   ED Comments Detail:  CM spoke with pt who [place]. CM discussed and provided written information for self pay pcps, importance of pcp for f/u care, www.needymeds.org, www.goodrx.com, discounted pharmacies and other [place] resources such as [facility], [facility], affordable care act,  financial assistance, DSS and  health department  Reviewed resources for [place] self pay pcps like [facility], [facility] at [address], [facility], general medical clinics, [facility] plus others, medication  resources, [facility] out patient pharmacies and housing Pt voiced understanding and appreciation of resources provided  Provided [facility] contact information Agreed to referral completed Pt states correct demographics is in [employer]

## 2015-01-23 NOTE — ED Notes (Signed)
Bed: [facility] Expected date:  Expected time:  Means of arrival:  Comments: 

## 2015-01-23 NOTE — ED Notes (Signed)
Pt reports being Dx with tinea corporis in October here. Sts it never cleared up, only worsened. Would like this checked, this is his main concern. But also wants to be evaluated for HA x1 month 2/2 bulging disc. Tylenol no longer controlling the pain.

## 2015-03-20 ENCOUNTER — Emergency Department (HOSPITAL_COMMUNITY)
Admission: EM | Admit: 2015-03-20 | Discharge: 2015-03-20 | Disposition: A | Discharge status: Home / Self Care | Payer: 59 | Attending: Emergency Medicine | Admitting: Emergency Medicine

## 2015-03-20 ENCOUNTER — Encounter (HOSPITAL_COMMUNITY): Payer: Self-pay | Admitting: Emergency Medicine

## 2015-03-20 DIAGNOSIS — Z720 Tobacco use: Secondary | ICD-10-CM | POA: Diagnosis not present

## 2015-03-20 DIAGNOSIS — K029 Dental caries, unspecified: Secondary | ICD-10-CM | POA: Diagnosis not present

## 2015-03-20 DIAGNOSIS — Z8659 Personal history of other mental and behavioral disorders: Secondary | ICD-10-CM | POA: Insufficient documentation

## 2015-03-20 DIAGNOSIS — Z8739 Personal history of other diseases of the musculoskeletal system and connective tissue: Secondary | ICD-10-CM | POA: Diagnosis not present

## 2015-03-20 DIAGNOSIS — K047 Periapical abscess without sinus: Secondary | ICD-10-CM | POA: Insufficient documentation

## 2015-03-20 DIAGNOSIS — R220 Localized swelling, mass and lump, head: Secondary | ICD-10-CM | POA: Diagnosis present

## 2015-03-20 DIAGNOSIS — K002 Abnormalities of size and form of teeth: Secondary | ICD-10-CM | POA: Insufficient documentation

## 2015-03-20 MED ORDER — AMOXICILLIN 500 MG PO CAPS
500.0000 mg | ORAL_CAPSULE | Freq: Once | ORAL | Status: AC
  Administered 2015-03-20: 500 mg via ORAL
  Filled 2015-03-20: qty 1

## 2015-03-20 MED ORDER — AMOXICILLIN 500 MG PO CAPS: 500.0000 mg | ORAL_CAPSULE | Freq: Three times a day (TID) | ORAL | Status: DC

## 2015-03-20 MED ORDER — NAPROXEN 500 MG PO TABS
500.0000 mg | ORAL_TABLET | Freq: Once | ORAL | Status: AC
  Administered 2015-03-20: 500 mg via ORAL
  Filled 2015-03-20: qty 1

## 2015-03-20 MED ORDER — NAPROXEN 500 MG PO TABS: 500.0000 mg | ORAL_TABLET | Freq: Two times a day (BID) | ORAL | Status: DC

## 2015-03-20 NOTE — ED Notes (Addendum)
patient states he woke up this morning and noticed his jaw was swollen, states it has continued to swell and is painful. Patient has not taken anything for pain. Denies difficulty swallowing. Patient states his immunizations are current.

## 2015-03-20 NOTE — Discharge Instructions (Signed)
Dental Abscess °A dental abscess is a collection of infected fluid (pus) from a bacterial infection in the inner part of the tooth (pulp). It usually occurs at the end of the tooth's root.  °CAUSES  °· Severe tooth decay. °· Trauma to the tooth that allows bacteria to enter into the pulp, such as a broken or chipped tooth. °SYMPTOMS  °· Severe pain in and around the infected tooth. °· Swelling and redness around the abscessed tooth or in the mouth or face. °· Tenderness. °· Pus drainage. °· Bad breath. °· Bitter taste in the mouth. °· Difficulty swallowing. °· Difficulty opening the mouth. °· Nausea. °· Vomiting. °· Chills. °· Swollen neck glands. °DIAGNOSIS  °· A medical and dental history will be taken. °· An examination will be performed by tapping on the abscessed tooth. °· X-rays may be taken of the tooth to identify the abscess. °TREATMENT °The goal of treatment is to eliminate the infection. You may be prescribed antibiotic medicine to stop the infection from spreading. A root canal may be performed to save the tooth. If the tooth cannot be saved, it may be pulled (extracted) and the abscess may be drained.  °HOME CARE INSTRUCTIONS °· Only take over-the-counter or prescription medicines for pain, fever, or discomfort as directed by your caregiver. °· Rinse your mouth (gargle) often with salt water (¼ tsp salt in 8 oz [250 ml] of warm water) to relieve pain or swelling. °· Do not drive after taking pain medicine (narcotics). °· Do not apply heat to the outside of your face. °· Return to your dentist for further treatment as directed. °SEEK MEDICAL CARE IF: °· Your pain is not helped by medicine. °· Your pain is getting worse instead of better. °SEEK IMMEDIATE MEDICAL CARE IF: °· You have a fever or persistent symptoms for more than 2-3 days. °· You have a fever and your symptoms suddenly get worse. °· You have chills or a very bad headache. °· You have problems breathing or swallowing. °· You have trouble  opening your mouth. °· You have swelling in the neck or around the eye. °Document Released: 12/12/2005 Document Revised: 09/05/2012 Document Reviewed: 03/22/2011 °ExitCare® Patient Information ©2015 [employer]. This information is not intended to replace advice given to you by your health care provider. Make sure you discuss any questions you have with your health care provider. ° ° °Emergency Department Resource Guide °1) Find a Doctor and Pay Out of Pocket °Although you won't have to find out who is covered by your insurance plan, it is a good idea to ask around and get recommendations. You will then need to call the office and see if the doctor you have chosen will accept you as a new patient and what types of options they offer for patients who are self-pay. Some doctors offer discounts or will set up payment plans for their patients who do not have insurance, but you will need to ask so you aren't surprised when you get to your appointment. ° °2) Contact Your Local Health Department °Not all health departments have doctors that can see patients for sick visits, but many do, so it is worth a call to see if yours does. If you don't know where your local health department is, you can check in your phone book. The CDC also has a tool to help you locate your state's health department, and many state websites also have listings of all of their local health departments. ° °3) Find a Walk-in   Clinic °If your illness is not likely to be very severe or complicated, you may want to try a walk in clinic. These are popping up all over the country in pharmacies, drugstores, and shopping centers. They're usually staffed by nurse practitioners or physician assistants that have been trained to treat common illnesses and complaints. They're usually fairly quick and inexpensive. However, if you have serious medical issues or chronic medical problems, these are probably not your best option. ° °No Primary Care Doctor: °- Call  [facility] at  832-8000 - they can help you locate a primary care doctor that  accepts your insurance, provides certain services, etc. °- Physician Referral Service- 1-800-533-3463 ° °Chronic Pain Problems: °Organization         Address  Phone   Notes  ° [facility]  (336) 297-2271 Patients need to be referred by their primary care doctor.  ° °Medication Assistance: °Organization         Address  Phone   Notes  °[place] [facility] [address] °[place], [place] [address] (336) 641-8030 --Must be a resident of [place] °-- Must have NO insurance coverage whatsoever (no Medicaid/ Medicare, etc.) °-- The pt. MUST have a primary care doctor that directs their care regularly and follows them in the community °  °[facility]  (866) 331-1348   °[employer]  (888) 892-1162   ° °Agencies that provide inexpensive medical care: °Organization         Address  Phone   Notes  °[facility]  (336) 832-8035   °[facility]    (336) 832-7272   °[facility] [address] °[place], [place] [address] (336) 832-4777   °[facility] [address], °[place] (336) 271-4999   °[facility]    (336) 373-0678   °[facility]    (336) 272-1050   °[facility] ° [address], [place] Phone:  (336) 832-4444, Fax:  (336) 832-4440 Hours of Operation:  9 am - 6 pm, M-F.  Also accepts Medicaid/Medicare and self-pay.  °[facility] ° [address], [place] Phone: (336) 832-3150, Fax: (336) 832-3151. Hours of Operation:  8:30 am - 5:30 pm, M-F.  Also accepts Medicaid and self-pay.  °[facility] [place] [address], [place] Phone: (336) 878-6027   °[facility] [address], [place], [place] (336)723-1848, Ext. 123 Mondays & Thursdays: 7-9 AM.  First 15 patients are seen on a first come, first serve  basis. °  ° °Medicaid-accepting [place] Providers: ° °Organization         Address  Phone   Notes  °[facility] [address], [place] (336) 641-2100 Also accepts self-pay patients.  °[facility] [address], [place] ° (336) 856-9996   °[facility] [address], [place] (336) 288-8857   °[facility] [address] [place] [address], [place] (336) 299-7000   °Veita Bland [address], [place]  ° (336) 373-1557 Only accepts [place] Access Medicaid patients after they have their name applied to their card.  ° °Self-Pay (no insurance) in [place]: ° °Organization         Address  Phone   Notes  °Sickle Cell Patients, [facility] [address], [place] (  336) 832-1970   °[facility] [address], [place] (336) 832-4400   °[facility] ° [address], [place] (336) 992-4800   °[facility]/Dr. Osei-Bonsu ° [address] [place] [address], [place] or [address], [place] (336) 841-8500 Phone number for both [place] and [place] locations is the same.  °[facility] [address], [place] (336) 299-0000   °[facility] [address] [place] [address], [place] or [address] (336) 852-7530 °(336) 878-2260   °[facility] [address], [place] (336) 350-1642, phone; (336) 294-5005, fax Sees patients 1st and 3rd Saturday of every month.  Must not qualify for public or private insurance (i.e. Medicaid, Medicare, [place] Health Choice, Veterans' Benefits) • Household income should be no more than 200% of the poverty level •The clinic cannot treat you if you are pregnant or think you are pregnant • Sexually transmitted diseases are not treated at the clinic.  ° ° °Dental Care: °Organization         Address  Phone  Notes  °[place]  [facility] [address], [place] (336) 641-6152 Accepts children up to age 21 who are enrolled in Medicaid or [place] Health Choice; pregnant women with a Medicaid card; and children who have applied for Medicaid or [place] Health Choice, but were declined, whose parents can pay a reduced fee at time of service.  °[facility] [place]  [address], [place] (336) 641-7733 Accepts children up to age 21 who are enrolled in Medicaid or [place] Health Choice; pregnant women with a Medicaid card; and children who have applied for Medicaid or [place] Health Choice, but were declined, whose parents can pay a reduced fee at time of service.  °[facility] ° [address], [place] (336) 641-4533 Patients are seen by appointment only. Walk-ins are not accepted. [facility] will see patients 18 years of age and older. °Monday - Tuesday (8am-5pm) °Most Wednesdays (8:30-5pm) °$30 per visit, cash only  °[facility] ° [address], [place] (336) 641-4533 Patients are seen by appointment only. Walk-ins are not accepted. [facility] will see patients 18 years of age and older. °One Wednesday Evening (Monthly: Volunteer Based).  $30 per visit, cash only  °[facility]  (919) 537-3737 for adults; Children under age 4, call Graduate Pediatric Dentistry at ([phone]) [phone]. Children aged 4-14, please call (919) [phone] to request a pediatric application. ° Dental services are provided in all areas of dental care including fillings, crowns and bridges, complete and partial dentures, implants, gum treatment, root canals, and extractions. Preventive care is also provided. Treatment is provided to both adults and children. °Patients are selected via a lottery and there is often a waiting list. °  °[facility] [address], ° ° (336) 763-8833  [email or web address].drcivils.com °  °[facility] [address], [place], [place] (336)723-1848, Ext. 123 Second and Fourth Thursday of each month, opens at 6:30 AM; Clinic ends at 9 AM.  Patients are seen on a first-come first-served basis, and a limited number are seen during each clinic.  ° °[facility] ° [address], [place], [place] (336) 723-7904   Eligibility Requirements °You must have lived in [place], [place], or [place] counties   for at least the last three months. °  You cannot be eligible for state or federal sponsored healthcare insurance, including Veterans Administration, Medicaid, or Medicare. °  You generally cannot be eligible for healthcare insurance through your employer.  °  How to apply: °Eligibility screenings are held every Tuesday and Wednesday afternoon from 1:00 pm until 4:00 pm. You do not need an appointment for the interview!  °[facility] [address], [place], [place] 336-631-2330   °[facility]  336-342-8273   °[facility]  336-703-3100   °[facility]  336-570-6415   ° °Behavioral Health Resources in the Community: °Intensive Outpatient Programs °Organization         Address  Phone  Notes  °[facility] [address], [place], [place] 336-878-6098   °[facility] [address], [place], [place] 336-832-9800   °[facility] [address], [place], [place] ° 336-882-2125   °[facility] [address],  °[place], [place] 1-800-853-5163 or 336-641-4981   °Substance Abuse Resources °Organization         Address  Phone  Notes  °[facility]  336-882-2125   °[facility]  336-784-9470   °[facility]  336-285-9073   °[facility]  336-845-3988   °Residential & Outpatient Substance Abuse Program  1-800-659-3381   °Psychological Services °Organization          Address  Phone  Notes  °[facility]  336- 832-9600   °[facility]  336- 378-7881   °[facility] [address], [place] 1-800-853-5163 or 336-641-4981   ° °Mobile Crisis Teams °Organization         Address  Phone  Notes  °[facility], Mobile Crisis Care Unit  1-877-626-1772   °Assertive °[facility] ° [address] [place], [place] 336-834-9664   °Sharon DeEsch [address] °[place] 336-554-5454   ° °Self-Help/Support Groups °Organization         Address  Phone             Notes  °[facility] - variety of support groups  336- 373-1402 Call for more information  °Narcotics Anonymous (NA), [facility] [address], °[place]  2 meetings at this location  ° °Residential Treatment Programs °Organization         Address  Phone  Notes  °[facility] [address],    °[place]  1-866-801-8205   °[facility] ° [address], [place], [place] 704-293-8524   °[facility] [address], [place] 336-845-3988 Admissions: 8am-3pm M-F  °[facility] [address]    °[place], [place] 336-841-1104   °[facility] [address], [place], [place] 336-379-7146   °[facility] [address]  °[place], [place] 336-285-9073   °[facility] - Intensive Outpatient [address], , [place] 336-852-3033   °[facility] [address]  °[place], [place] 1-877-615-2722 or 336-784-9470   °[facility] [address] [place], [place] 336-227-7417 Accepts Medicaid  °[facility] [address]  °  1-800-659-3381 Substance Abuse/Addiction Treatment  ° °[place] Behavioral Health Resources °Organization         Address  Phone  Notes  °[facility]  (888)   581-9988   °Julie Brannon, PhD [address] [place], [place]   (336) 349-5553 or (336) 951-0000   °[facility]   [address] °[place], [place] (336) 349-4454   °[facility] [address], [place], [place] (336) 342-8316 Insurance/Medicaid/sponsorship through [facility]  °[facility] [address]                                    [place], [place] (336) 342-8316 Therapy/tele-psych/case  °[facility] [address]  ° [place], [place] (336) 349-2233    °Dr. Arfeen  (336) 349-4544   °[facility] [place]  [employer] [place] [facility] 1) [address], [place] °2) [address], [place] °3)  [address], [place] (336) 349-3220 °(336) 342-7768 ° °(336) 342-8140   °[place] [facility] (336) 342-1394 or (336) 342-3537 (After Hours)    ° ° ° °

## 2015-03-20 NOTE — ED Provider Notes (Signed)
CSN: [id number]     Arrival date & time 03/20/15  2205 History   First MD Initiated Contact with Patient 03/20/15 2231     Chief Complaint  Patient presents with  . Facial Swelling    right jaw   The history is provided by the patient. No language interpreter was used.   This chart was scribed for non-physician practitioner Antony Madura, PA-C, working with Benjiman Core, MD, by Andrew Au, ED Scribe. This patient was seen in room WTR6/WTR6 and the patient's care was started at 10:56 PM.  Isaac Olson is a 24 y.o. male who presents to the Emergency Department complaining of worsening right jaw swelling that began this morning. . Pt states he woke up this morning with mild swelling to right jaw that worsened throughout the day. He reports associated pain to right jaw, with right dental pain when touching jaw.  pt denies fever, pain with swallowing. Pt denies having a dentist. He denies drug allergies.   Past Medical History  Diagnosis Date  . ADHD (attention deficit hyperactivity disorder)   . Bulge of cervical disc without myelopathy    No past surgical history on file. Family History  Problem Relation Age of Onset  . Diabetes Other   . Heart failure Other   . Diabetes Maternal Grandmother    History  Substance Use Topics  . Smoking status: Current Every Day Smoker -- 0.10 packs/day    Types: Cigarettes  . Smokeless tobacco: Not on file  . Alcohol Use: No    Review of Systems  HENT: Positive for dental problem and facial swelling.   All other systems reviewed and are negative.   Allergies  Review of patient's allergies indicates no known allergies.  Home Medications   Prior to Admission medications   Medication Sig Start Date End Date Taking? Authorizing Provider  amoxicillin (AMOXIL) 500 MG capsule Take 1 capsule (500 mg total) by mouth 3 (three) times daily. 03/20/15   Antony Madura, PA-C  naproxen (NAPROSYN) 500 MG tablet Take 1 tablet (500 mg total) by mouth 2 (two)  times daily. 03/20/15   Antony Madura, PA-C  terbinafine (LAMISIL AT) 1 % cream Apply 1 application topically 2 (two) times daily. Patient not taking: Reported on 03/20/2015 10/14/14   Tatyana Kirichenko, PA-C   BP 142/76 mmHg  Pulse 92  Temp(Src) 97.7 F (36.5 C) (Oral)  Resp 16  Ht  (1.753 m)  Wt 220 lb (99.791 kg)  BMI 32.47 kg/m2  SpO2 98%   Physical Exam  Constitutional: He is oriented to person, place, and time. He appears well-developed and well-nourished. No distress.  Nontoxic/nonseptic appearing  HENT:  Head: Normocephalic and atraumatic.  Right Ear: Tympanic membrane, external ear and ear canal normal.  Left Ear: Tympanic membrane, external ear and ear canal normal.  Nose: Nose normal.  Mouth/Throat: Uvula is midline, oropharynx is clear and moist and mucous membranes are normal. No trismus in the jaw. Abnormal dentition. Dental abscesses and dental caries present. No uvula swelling. No oropharyngeal exudate.    No trismus or stridor.  Eyes: Conjunctivae and EOM are normal. Pupils are equal, round, and reactive to light. No scleral icterus.  Neck: Normal range of motion.  No nuchal rigidity or meningismus. No stridor.  Pulmonary/Chest: Effort normal. No respiratory distress.  Respirations even and unlabored  Musculoskeletal: Normal range of motion.  Neurological: He is alert and oriented to person, place, and time. He exhibits normal muscle tone. Coordination normal.  Skin:  Skin is warm and dry. No rash noted. He is not diaphoretic. No erythema. No pallor.  Psychiatric: He has a normal mood and affect. His behavior is normal.  Nursing note and vitals reviewed.   ED Course  Procedures (including critical care time) DIAGNOSTIC STUDIES: Oxygen Saturation is 98% on RA, normal by my interpretation.    COORDINATION OF CARE: 10:56 PM- Pt advised of plan for treatment and pt agrees.  Labs Review Labs Reviewed - No data to display  Imaging Review No results  found.   EKG Interpretation None      MDM   Final diagnoses:  Dental abscess    Patient with facial swelling x 1 day. Dental caries noted with cracked R lower 1st molar and associated TTP. Suspect early dental abscess. No trismus or stridor. Exam unconcerning for Ludwig's angina or spread of infection. Will treat with amoxicillin and pain medicine. Urged patient to follow-up with dentist. Resource guide provided. Patient agreeable to plan with no unaddressed concerns.    I personally performed the services described in this documentation, which was scribed in my presence. The recorded information has been reviewed and is accurate.   Filed Vitals:   03/20/15 2209  BP: 142/76  Pulse: 92  Temp: 97.7 F (36.5 C)  TempSrc: Oral  Resp: 16  Height: 5\' 9"  (1.753 m)  Weight: 220 lb (99.791 kg)  SpO2: 98%      Antony MaduraKelly Adhira Jamil, PA-C 03/20/15 2302  Benjiman CoreNathan Pickering, MD 03/20/15 [phone]

## 2015-06-18 ENCOUNTER — Ambulatory Visit (INDEPENDENT_AMBULATORY_CARE_PROVIDER_SITE_OTHER): Payer: 59 | Admitting: Family Medicine

## 2015-06-18 VITALS — BP 120/80 | HR 66 | Temp 99.0°F | Resp 16 | Ht 70.5 in | Wt 239.0 lb

## 2015-06-18 DIAGNOSIS — L299 Pruritus, unspecified: Secondary | ICD-10-CM | POA: Diagnosis not present

## 2015-06-18 DIAGNOSIS — L237 Allergic contact dermatitis due to plants, except food: Secondary | ICD-10-CM | POA: Diagnosis not present

## 2015-06-18 MED ORDER — TRIAMCINOLONE ACETONIDE 0.1 % EX CREA: 1.0000 "application " | TOPICAL_CREAM | Freq: Three times a day (TID) | CUTANEOUS | Status: DC | PRN

## 2015-06-18 MED ORDER — PREDNISONE 20 MG PO TABS: ORAL_TABLET | ORAL | Status: DC

## 2015-06-18 NOTE — Patient Instructions (Signed)
Apply the steroid cream to affected area up to 3 times per day (avoid eyes and mouth). If rash on face or head still present tomorrow - change from steroid cream to prednisone pills.  Allegra once per day for itching and if needed at night, you can take 1 benadryl to help with sleep or itching.   If rash not imprving into next week, call me as may need to try a different medicine as scabies also possible, but less likely cause of your symptoms.   Return to the clinic or go to the nearest emergency room if any of your symptoms worsen or new symptoms occur.  Poison [employer] ivy is a inflammation of the skin (contact dermatitis) caused by touching the allergens on the leaves of the ivy plant following previous exposure to the plant. The rash usually appears 48 hours after exposure. The rash is usually bumps (papules) or blisters (vesicles) in a linear pattern. Depending on your own sensitivity, the rash may simply cause redness and itching, or it may also progress to blisters which may break open. These must be well cared for to prevent secondary bacterial (germ) infection, followed by scarring. Keep any open areas dry, clean, dressed, and covered with an antibacterial ointment if needed. The eyes may also get puffy. The puffiness is worst in the morning and gets better as the day progresses. This dermatitis usually heals without scarring, within 2 to 3 weeks without treatment. HOME CARE INSTRUCTIONS  Thoroughly wash with soap and water as soon as you have been exposed to poison ivy. You have about one half hour to remove the plant resin before it will cause the rash. This washing will destroy the oil or antigen on the skin that is causing, or will cause, the rash. Be sure to wash under your fingernails as any plant resin there will continue to spread the rash. Do not rub skin vigorously when washing affected area. Poison ivy cannot spread if no oil from the plant remains on your body. A rash that has  progressed to weeping sores will not spread the rash unless you have not washed thoroughly. It is also important to wash any clothes you have been wearing as these may carry active allergens. The rash will return if you wear the unwashed clothing, even several days later. Avoidance of the plant in the future is the best measure. Poison ivy plant can be recognized by the number of leaves. Generally, poison ivy has three leaves with flowering branches on a single stem. Diphenhydramine may be purchased over the counter and used as needed for itching. Do not drive with this medication if it makes you drowsy.Ask your caregiver about medication for children. SEEK MEDICAL CARE IF:  Open sores develop.  Redness spreads beyond area of rash.  You notice purulent (pus-like) discharge.  You have increased pain.  Other signs of infection develop (such as fever). Document Released: 12/09/2000 Document Revised: 03/05/2012 Document Reviewed: 05/22/2009 [facility] Patient Information 2015 [place], [place]. This information is not intended to replace advice given to you by your health care provider. Make sure you discuss any questions you have with your health care provider.

## 2015-06-18 NOTE — Progress Notes (Signed)
Subjective:  This chart was scribed for Isaac Staggers, MD by [facility], medical scribe at [facility].The patient was seen in exam room 02 and the patient's care was started at 2:30 PM.   Patient ID: Isaac Olson, male    DOB: 31-Aug-1991, 24 y.o.   MRN: [id number] Chief Complaint  Patient presents with  . Rash    x 2 days   HPI HPI Comments: Isaac Olson is a 24 y.o. male who presents to [facility] complaining of a rash on both hands in between the digits, forehead, legs, and trunk. The rash on the scalp began today. Itching but no pain. He has itching around his genitals but no rash found. He did work in the yard a few days before onset of the rash. He did try an antifungal cream and a cortisone cream. He is unsure if he came in contact with poison ivy but he was wearing short sleeve shirt, shorts and no gloves. Nobody at home has a similar rash. Pt denies fever, shortness of breath, and no new headache.  Seen in the ED January 16 with a rash on his upper extremities for few months. He used OTC lotrimin for no relief. Suspected psoriasis and referred to dermatology. He did not go to dermatology because the rash had gone away.  There are no active problems to display for this patient.  Past Medical History  Diagnosis Date  . ADHD (attention deficit hyperactivity disorder)   . Bulge of cervical disc without myelopathy   . Stomach ulcer    History reviewed. No pertinent past surgical history. No Known Allergies Prior to Admission medications   Medication Sig Start Date End Date Taking? Authorizing Provider  terbinafine (LAMISIL AT) 1 % cream Apply 1 application topically 2 (two) times daily. 10/14/14  Yes Tatyana Kirichenko, PA-C  naproxen (NAPROSYN) 500 MG tablet Take 1 tablet (500 mg total) by mouth 2 (two) times daily. Patient not taking: Reported on 06/18/2015 03/20/15   Antony Madura, PA-C   History   Social History  . Marital Status:  Single    Spouse Name: N/A  . Number of Children: N/A  . Years of Education: N/A   Occupational History  . Not on file.   Social History Main Topics  . Smoking status: Current Every Day Smoker -- 0.10 packs/day    Types: Cigarettes  . Smokeless tobacco: Not on file  . Alcohol Use: No  . Drug Use: No  . Sexual Activity: Not on file   Other Topics Concern  . Not on file   Social History Narrative   Review of Systems  Constitutional: Negative for fever.  Respiratory: Negative for shortness of breath.   Skin: Positive for rash.  Neurological: Negative for headaches.      Objective:  BP 120/80 mmHg  Pulse 66  Temp(Src) 99 F (37.2 C) (Oral)  Resp 16  Ht 5' 10.5" (1.791 m)  Wt 239 lb (108.41 kg)  BMI 33.80 kg/m2  SpO2 98% Physical Exam  Constitutional: He is oriented to person, place, and time. He appears well-developed and well-nourished. No distress.  HENT:  Head: Normocephalic and atraumatic.  Eyes: Pupils are equal, round, and reactive to light.  Neck: Normal range of motion.  Cardiovascular: Normal rate and regular rhythm.   Pulmonary/Chest: Effort normal. No respiratory distress.  Musculoskeletal: Normal range of motion.  Neurological: He is alert and oriented to person, place, and time.  Skin: Skin is warm and dry.  Left hip has a linear area of excoriation and slightly raw appearance. Right hip has a similar area or linear excoriation with slight erythema.  On the left arm there are some striae but also excoriated papules on the left lateral arm. He does have a few excoriated linear regions. There are three interdigital lesions on the left hand. Right hand similar appearing excoriated papules. At the dorsum of the right hand there are a few linear excortication and some burrow. There are a few scattered papules on the right arm,  On the frontal scalp there are few scattered papules in a  linear manner.  Psychiatric: He has a normal mood and affect. His behavior  is normal.  Nursing note and vitals reviewed.     Assessment & Plan:   Isaac Olson is a 24 y.o. male Contact dermatitis due to poison ivy - Plan: triamcinolone cream (KENALOG) 0.1 %, predniSONE (DELTASONE) 20 MG tablet  Pruritus - Plan: triamcinolone cream (KENALOG) 0.1 %, predniSONE (DELTASONE) 20 MG tablet  Suspected contact derm from poison ivy with pulling weeds few days prior to start of symptoms and no known sick contacts, involvement of exposed areas, well as scalp involvement. Less likely scabies.   -initial trial of triamcinolone topically up to TID, allegra QD, benadryl an night.    -if scalp rash perists tomorrow or rash in groin - start prednisone - SED, RTC precautions discussed.   -if not improving into next week - consider Elimite for less likely scabies.   -rtc precautions.   Meds ordered this encounter  Medications  . triamcinolone cream (KENALOG) 0.1 %    Sig: Apply 1 application topically 3 (three) times daily as needed.    Dispense:  30 g    Refill:  0  . predniSONE (DELTASONE) 20 MG tablet    Sig: 3 by mouth for 3 days, then 2 by mouth for 2 days, then 1 by mouth for 2 days, then 1/2 by mouth for 2 days.    Dispense:  16 tablet    Refill:  0   Patient Instructions  Apply the steroid cream to affected area up to 3 times per day (avoid eyes and mouth). If rash on face or head still present tomorrow - change from steroid cream to prednisone pills.  Allegra once per day for itching and if needed at night, you can take 1 benadryl to help with sleep or itching.   If rash not imprving into next week, call me as may need to try a different medicine as scabies also possible, but less likely cause of your symptoms.   Return to the clinic or go to the nearest emergency room if any of your symptoms worsen or new symptoms occur.  Poison [employer] ivy is a inflammation of the skin (contact dermatitis) caused by touching the allergens on the leaves of the ivy plant  following previous exposure to the plant. The rash usually appears 48 hours after exposure. The rash is usually bumps (papules) or blisters (vesicles) in a linear pattern. Depending on your own sensitivity, the rash may simply cause redness and itching, or it may also progress to blisters which may break open. These must be well cared for to prevent secondary bacterial (germ) infection, followed by scarring. Keep any open areas dry, clean, dressed, and covered with an antibacterial ointment if needed. The eyes may also get puffy. The puffiness is worst in the morning and gets better  as the day progresses. This dermatitis usually heals without scarring, within 2 to 3 weeks without treatment. HOME CARE INSTRUCTIONS  Thoroughly wash with soap and water as soon as you have been exposed to poison ivy. You have about one half hour to remove the plant resin before it will cause the rash. This washing will destroy the oil or antigen on the skin that is causing, or will cause, the rash. Be sure to wash under your fingernails as any plant resin there will continue to spread the rash. Do not rub skin vigorously when washing affected area. Poison ivy cannot spread if no oil from the plant remains on your body. A rash that has progressed to weeping sores will not spread the rash unless you have not washed thoroughly. It is also important to wash any clothes you have been wearing as these may carry active allergens. The rash will return if you wear the unwashed clothing, even several days later. Avoidance of the plant in the future is the best measure. Poison ivy plant can be recognized by the number of leaves. Generally, poison ivy has three leaves with flowering branches on a single stem. Diphenhydramine may be purchased over the counter and used as needed for itching. Do not drive with this medication if it makes you drowsy.Ask your caregiver about medication for children. SEEK MEDICAL CARE IF:  Open sores  develop.  Redness spreads beyond area of rash.  You notice purulent (pus-like) discharge.  You have increased pain.  Other signs of infection develop (such as fever). Document Released: 12/09/2000 Document Revised: 03/05/2012 Document Reviewed: 05/22/2009 [facility] Patient Information 2015 [place], [place]. This information is not intended to replace advice given to you by your health care provider. Make sure you discuss any questions you have with your health care provider.   I personally performed the services described in this documentation, which was scribed in my presence. The recorded information has been reviewed and considered, and addended by me as needed.

## 2016-05-28 ENCOUNTER — Encounter (HOSPITAL_COMMUNITY): Payer: Self-pay | Admitting: Emergency Medicine

## 2016-05-28 ENCOUNTER — Emergency Department (HOSPITAL_COMMUNITY)
Admission: EM | Admit: 2016-05-28 | Discharge: 2016-05-28 | Disposition: A | Discharge status: Home / Self Care | Payer: No Typology Code available for payment source | Attending: Emergency Medicine | Admitting: Emergency Medicine

## 2016-05-28 DIAGNOSIS — M545 Low back pain, unspecified: Secondary | ICD-10-CM

## 2016-05-28 DIAGNOSIS — L237 Allergic contact dermatitis due to plants, except food: Secondary | ICD-10-CM

## 2016-05-28 DIAGNOSIS — L299 Pruritus, unspecified: Secondary | ICD-10-CM

## 2016-05-28 DIAGNOSIS — F17210 Nicotine dependence, cigarettes, uncomplicated: Secondary | ICD-10-CM | POA: Insufficient documentation

## 2016-05-28 DIAGNOSIS — Y9269 Other specified industrial and construction area as the place of occurrence of the external cause: Secondary | ICD-10-CM | POA: Insufficient documentation

## 2016-05-28 DIAGNOSIS — Y990 Civilian activity done for income or pay: Secondary | ICD-10-CM | POA: Insufficient documentation

## 2016-05-28 DIAGNOSIS — X501XXA Overexertion from prolonged static or awkward postures, initial encounter: Secondary | ICD-10-CM | POA: Insufficient documentation

## 2016-05-28 DIAGNOSIS — Y939 Activity, unspecified: Secondary | ICD-10-CM | POA: Insufficient documentation

## 2016-05-28 LAB — URINALYSIS, ROUTINE W REFLEX MICROSCOPIC
BILIRUBIN URINE: NEGATIVE
Glucose, UA: NEGATIVE mg/dL
KETONES UR: NEGATIVE mg/dL
Leukocytes, UA: NEGATIVE
NITRITE: NEGATIVE
PROTEIN: NEGATIVE mg/dL
SPECIFIC GRAVITY, URINE: 1.007 (ref 1.005–1.030)
pH: 6 (ref 5.0–8.0)

## 2016-05-28 LAB — URINE MICROSCOPIC-ADD ON

## 2016-05-28 MED ORDER — CYCLOBENZAPRINE HCL 10 MG PO TABS: 5.0000 mg | ORAL_TABLET | Freq: Two times a day (BID) | ORAL | Status: DC | PRN

## 2016-05-28 MED ORDER — HYDROCODONE-ACETAMINOPHEN 5-325 MG PO TABS
1.0000 | ORAL_TABLET | Freq: Once | ORAL | Status: AC
  Administered 2016-05-28: 1 via ORAL
  Filled 2016-05-28: qty 1

## 2016-05-28 MED ORDER — PREDNISONE 20 MG PO TABS
ORAL_TABLET | ORAL | Status: DC
Start: 2016-05-28 — End: 2016-07-15

## 2016-05-28 MED ORDER — KETOROLAC TROMETHAMINE 60 MG/2ML IM SOLN
60.0000 mg | Freq: Once | INTRAMUSCULAR | Status: AC
  Administered 2016-05-28: 60 mg via INTRAMUSCULAR
  Filled 2016-05-28: qty 2

## 2016-05-28 MED ORDER — TRAMADOL HCL 50 MG PO TABS: 50.0000 mg | ORAL_TABLET | Freq: Four times a day (QID) | ORAL | Status: DC | PRN

## 2016-05-28 NOTE — ED Provider Notes (Signed)
CSN: [id number]     Arrival date & time 05/28/16  0300 History   First MD Initiated Contact with Patient 05/28/16 0507     Chief Complaint  Patient presents with  . Back Pain     (Consider location/radiation/quality/duration/timing/severity/associated sxs/prior Treatment) HPI   Pt has hx of ADHD, bulge of cervical disc without myelopathy and stomach ulcer He works at [employer] which he describes as a lot of bending and is avery physical job. He is having pain to the right lower part of his back. Hurts at rest and with movement but becomes significant worse with movement and especially bending and twisting.  No dysuria, abdominal pain, N/V/D or fevers. He took his Sister's muscle relaxer but it did not help. He denies having any numbness, tingling or weakness to his legs. He denies having any injury that he can remember he also denies having any history of IV drug use, or loss of bowel or bladder control.   Past Medical History  Diagnosis Date  . ADHD (attention deficit hyperactivity disorder)   . Bulge of cervical disc without myelopathy   . Stomach ulcer    History reviewed. No pertinent past surgical history. Family History  Problem Relation Age of Onset  . Diabetes Other   . Heart failure Other   . Diabetes Maternal Grandmother   . Depression Mother    Social History  Substance Use Topics  . Smoking status: Current Every Day Smoker -- 0.10 packs/day    Types: Cigarettes  . Smokeless tobacco: None  . Alcohol Use: No    Review of Systems  Review of Systems All other systems negative except as documented in the HPI. All pertinent positives and negatives as reviewed in the HPI.   Allergies  Review of patient's allergies indicates no known allergies.  Home Medications   Prior to Admission medications   Medication Sig Start Date End Date Taking? Authorizing Provider  predniSONE (DELTASONE) 20 MG tablet 3 by mouth for 3 days, then 2 by mouth for 2 days, then 1 by  mouth for 2 days, then 1/2 by mouth for 2 days. 06/18/15   Shade FloodJeffrey R Caileb Rhue, MD  terbinafine (LAMISIL AT) 1 % cream Apply 1 application topically 2 (two) times daily. 10/14/14   Tatyana Kirichenko, PA-C  triamcinolone cream (KENALOG) 0.1 % Apply 1 application topically 3 (three) times daily as needed. 06/18/15   Shade FloodJeffrey R Orlondo Holycross, MD   BP 123/76 mmHg  Pulse 63  Temp(Src) 98.5 F (36.9 C) (Oral)  Resp 20  Ht 5\' 9"  (1.753 m)  SpO2 99% Physical Exam  Constitutional: He appears well-developed and well-nourished. No distress.  HENT:  Head: Normocephalic and atraumatic.  Right Ear: Tympanic membrane and ear canal normal.  Left Ear: Tympanic membrane and ear canal normal.  Nose: Nose normal.  Mouth/Throat: Uvula is midline, oropharynx is clear and moist and mucous membranes are normal.  Eyes: Pupils are equal, round, and reactive to light.  Neck: Normal range of motion. Neck supple.  Cardiovascular: Normal rate and regular rhythm.   Pulmonary/Chest: Effort normal.  Abdominal: Soft.  No signs of abdominal distention  Musculoskeletal:       Back:  No LE swelling Symmetrical and physiologic strength to bilateral lower extremities.  Neurosensory function adequate to both legs Skin color is normal. Skin is warm and moist.  No step off deformity appreciated and no midline bony tenderness.  Ambulatory  No crepitus, laceration, effusion, induration, lesions Pedal pulses are symmetrical and palpable  bilaterally  Tenderness to palpation of paraspinal and midline of spine (right)   Neurological: He is alert.  Acting at baseline  Skin: Skin is warm and dry. No rash noted.  Nursing note and vitals reviewed.   ED Course  Procedures (including critical care time) Labs Review Labs Reviewed  URINALYSIS, ROUTINE W REFLEX MICROSCOPIC (NOT AT [facility]) - Abnormal; Notable for the following:    Hgb urine dipstick TRACE (*)    All other components within normal limits  URINE MICROSCOPIC-ADD ON -  Abnormal; Notable for the following:    Squamous Epithelial / LPF 0-5 (*)    Bacteria, UA RARE (*)    All other components within normal limits    Imaging Review No results found. I have personally reviewed and evaluated these images and lab results as part of my medical decision-making.   EKG Interpretation None      MDM   Final diagnoses:  Right-sided low back pain without sciatica   Medications  ketorolac (TORADOL) injection 60 mg (not administered)  HYDROcodone-acetaminophen (NORCO/VICODIN) 5-325 MG per tablet 1 tablet (not administered)   25 y.o.Awad D Corradi's  with back pain.   No neurological deficits and normal neuro exam. No loss of bowel or bladder control. No concern for cauda equina at this time base on HPI and physical exam findings. No fever, night sweats, weight loss, h/o cancer, IVDU. The patient can walk with some discomfort.   Patient Plan 1. Medications: NSAIDs and/or muscle relaxer. Cont usual home medications unless otherwise directed. 2. Treatment: rest, drink plenty of fluids, gentle stretching as discussed, alternate ice and heat  3. Follow Up: Please followup with your primary doctor for discussion of your diagnoses and further evaluation after today's visit; if you do not have a primary care doctor use the resource guide provided to find one  Advised to follow-up with the orthopedist if symptoms do not start to resolve in the next 2-3 days. If develop loss of bowel or urinary control return to the ED as soon as possible for further evaluation. To take the medications as prescribed as they can cause harm if not taken appropriately.   Vital signs are stable at discharge. Filed Vitals:   05/28/16 0545 05/28/16 0600  BP: 136/82 123/76  Pulse: 79 63  Temp:    Resp:      Patient/guardian has voiced understanding and agreed to follow-up with the PCP or specialist.        Marlon Pel, PA-C 05/28/16 8469  Benjiman Core, MD 05/28/16  [id number]

## 2016-05-28 NOTE — ED Notes (Signed)
Pt stable, ambulatory, states understanding of discharge instructions 

## 2016-05-28 NOTE — ED Notes (Signed)
R lower back pain since 10pm that is worse with movement.  Denies known injury.  Denies urinary complaint.

## 2016-05-28 NOTE — Discharge Instructions (Signed)

## 2016-07-11 ENCOUNTER — Emergency Department (HOSPITAL_COMMUNITY): Payer: Self-pay

## 2016-07-11 ENCOUNTER — Inpatient Hospital Stay (HOSPITAL_COMMUNITY)
Admission: EM | Admit: 2016-07-11 | Discharge: 2016-07-15 | DRG: 603 | Disposition: A | Discharge status: Home / Self Care | Payer: Self-pay | Attending: Internal Medicine | Admitting: Internal Medicine

## 2016-07-11 ENCOUNTER — Encounter (HOSPITAL_COMMUNITY): Payer: Self-pay

## 2016-07-11 DIAGNOSIS — F909 Attention-deficit hyperactivity disorder, unspecified type: Secondary | ICD-10-CM | POA: Diagnosis present

## 2016-07-11 DIAGNOSIS — Z23 Encounter for immunization: Secondary | ICD-10-CM

## 2016-07-11 DIAGNOSIS — F17210 Nicotine dependence, cigarettes, uncomplicated: Secondary | ICD-10-CM | POA: Diagnosis present

## 2016-07-11 DIAGNOSIS — L03114 Cellulitis of left upper limb: Principal | ICD-10-CM

## 2016-07-11 DIAGNOSIS — L0390 Cellulitis, unspecified: Secondary | ICD-10-CM

## 2016-07-11 DIAGNOSIS — R609 Edema, unspecified: Secondary | ICD-10-CM

## 2016-07-11 LAB — COMPREHENSIVE METABOLIC PANEL
ALT: 23 U/L (ref 17–63)
AST: 24 U/L (ref 15–41)
Albumin: 4 g/dL (ref 3.5–5.0)
Alkaline Phosphatase: 77 U/L (ref 38–126)
Anion gap: 8 (ref 5–15)
BUN: 8 mg/dL (ref 6–20)
CO2: 24 mmol/L (ref 22–32)
CREATININE: 1 mg/dL (ref 0.61–1.24)
Calcium: 9.4 mg/dL (ref 8.9–10.3)
Chloride: 103 mmol/L (ref 101–111)
GFR calc Af Amer: >60 mL/min (ref >60)
Glucose, Bld: 113 mg/dL — ABNORMAL HIGH (ref 65–99)
Potassium: 3.6 mmol/L (ref 3.5–5.1)
Sodium: 135 mmol/L (ref 135–145)
TOTAL PROTEIN: 7.8 g/dL (ref 6.5–8.1)
Total Bilirubin: 1.1 mg/dL (ref 0.3–1.2)

## 2016-07-11 LAB — CBC WITH DIFFERENTIAL/PLATELET
BASOS ABS: 0 10*3/uL (ref 0.0–0.1)
Basophils Relative: 0 %
Eosinophils Absolute: 0 10*3/uL (ref 0.0–0.7)
Eosinophils Relative: 0 %
HCT: 42.7 % (ref 39.0–52.0)
Hemoglobin: 13.8 g/dL (ref 13.0–17.0)
LYMPHS ABS: 1.6 10*3/uL (ref 0.7–4.0)
Lymphocytes Relative: 8 %
MCH: 26.9 pg (ref 26.0–34.0)
MCHC: 32.3 g/dL (ref 30.0–36.0)
MCV: 83.2 fL (ref 78.0–100.0)
Monocytes Absolute: 1.8 10*3/uL — ABNORMAL HIGH (ref 0.1–1.0)
Monocytes Relative: 10 %
NEUTROS ABS: 15.7 10*3/uL — AB (ref 1.7–7.7)
Neutrophils Relative %: 82 %
Platelets: 175 10*3/uL (ref 150–400)
RBC: 5.13 MIL/uL (ref 4.22–5.81)
RDW: 13.3 % (ref 11.5–15.5)
WBC: 19.1 10*3/uL — ABNORMAL HIGH (ref 4.0–10.5)

## 2016-07-11 LAB — I-STAT CG4 LACTIC ACID, ED: LACTIC ACID, VENOUS: 0.85 mmol/L (ref 0.5–1.9)

## 2016-07-11 MED ORDER — VANCOMYCIN HCL IN DEXTROSE 1-5 GM/200ML-% IV SOLN
1000.0000 mg | Freq: Once | INTRAVENOUS | Status: AC
  Administered 2016-07-11: 1000 mg via INTRAVENOUS
  Filled 2016-07-11: qty 200

## 2016-07-11 MED ORDER — SODIUM CHLORIDE 0.9 % IV SOLN
Freq: Once | INTRAVENOUS | Status: AC
  Administered 2016-07-11: via INTRAVENOUS

## 2016-07-11 NOTE — ED Notes (Signed)
IV team at bedside 

## 2016-07-11 NOTE — ED Provider Notes (Signed)
CSN: [id number]     Arrival date & time 07/11/16  1846 History   First MD Initiated Contact with Patient 07/11/16 2231     Chief Complaint  Patient presents with  . Arm Swelling     (Consider location/radiation/quality/duration/timing/severity/associated sxs/prior Treatment) HPI Comments: This a 25 year old male who reports that since Thursday, 5 days ago.  He's noticed some redness, pain and swelling to his left elbow.  He denies any trauma or injury.  No inoculation from a puncture is unaware of any insect bite.  The swelling is progressively gotten worse.  He has tried elevation and ice without any relief Denies any IV drug use  The history is provided by the patient.    Past Medical History  Diagnosis Date  . ADHD (attention deficit hyperactivity disorder)   . Bulge of cervical disc without myelopathy   . Stomach ulcer    History reviewed. No pertinent past surgical history. Family History  Problem Relation Age of Onset  . Diabetes Other   . Heart failure Other   . Diabetes Maternal Grandmother   . Depression Mother    Social History  Substance Use Topics  . Smoking status: Current Every Day Smoker -- 0.10 packs/day    Types: Cigarettes  . Smokeless tobacco: None  . Alcohol Use: No    Review of Systems  Constitutional: Negative for fever and chills.  Respiratory: Negative for shortness of breath.   Cardiovascular: Negative for chest pain.  Musculoskeletal: Positive for joint swelling.  Skin: Positive for color change. Negative for wound.  Neurological: Positive for numbness.  All other systems reviewed and are negative.     Allergies  Review of patient's allergies indicates no known allergies.  Home Medications   Prior to Admission medications   Medication Sig Start Date End Date Taking? Authorizing Provider  cyclobenzaprine (FLEXERIL) 10 MG tablet Take 0.5-1 tablets (5-10 mg total) by mouth 2 (two) times daily as needed. 05/28/16   Tiffany Neva SeatGreene, PA-C    predniSONE (DELTASONE) 20 MG tablet 3 by mouth for 3 days, then 2 by mouth for 2 days, then 1 by mouth for 2 days, then 1/2 by mouth for 2 days. 05/28/16   Tiffany Neva SeatGreene, PA-C  terbinafine (LAMISIL AT) 1 % cream Apply 1 application topically 2 (two) times daily. 10/14/14   Tatyana Kirichenko, PA-C  traMADol (ULTRAM) 50 MG tablet Take 1 tablet (50 mg total) by mouth every 6 (six) hours as needed. 05/28/16   Marlon Peliffany Greene, PA-C  triamcinolone cream (KENALOG) 0.1 % Apply 1 application topically 3 (three) times daily as needed. 06/18/15   Shade FloodJeffrey R Greene, MD   BP 125/77 mmHg  Pulse 93  Temp(Src) 99.8 F (37.7 C) (Oral)  Resp 16  Ht 5\' 9"  (1.753 m)  SpO2 96% Physical Exam  Constitutional: He appears well-developed and well-nourished. No distress.  HENT:  Head: Normocephalic and atraumatic.  Eyes: Pupils are equal, round, and reactive to light.  Neck: Normal range of motion.  Cardiovascular: Normal rate and regular rhythm.   Pulmonary/Chest: Effort normal and breath sounds normal.  Musculoskeletal: He exhibits edema and tenderness.       Left elbow: He exhibits swelling. He exhibits no effusion, no deformity and no laceration. Tenderness found.       Arms: Lymphadenopathy:    He has no cervical adenopathy.  Neurological: He is alert.  Skin: Skin is warm.  Nursing note and vitals reviewed.   ED Course  Procedures (including critical care time) Labs  Review Labs Reviewed  CBC WITH DIFFERENTIAL/PLATELET - Abnormal; Notable for the following:    WBC 19.1 (*)    Neutro Abs 15.7 (*)    Monocytes Absolute 1.8 (*)    All other components within normal limits  COMPREHENSIVE METABOLIC PANEL - Abnormal; Notable for the following:    Glucose, Bld 113 (*)    All other components within normal limits  CULTURE, BLOOD (ROUTINE X 2)  CULTURE, BLOOD (ROUTINE X 2)  I-STAT CG4 LACTIC ACID, ED    Imaging Review Dg Elbow Complete Left  07/11/2016  CLINICAL DATA:  Left elbow pain and swelling.  Left elbow appears red and swollen. Patient reports chills at home. Elbow and forearm are approximately twice the size of the contralateral arm. EXAM: LEFT ELBOW - COMPLETE 3+ VIEW COMPARISON:  None FINDINGS: Four views of the left elbow are provided. Osseous alignment is normal. Bone mineralization is normal. No fracture line or displaced fracture fragment seen. No focal cortical irregularity or osseous lesion. No degenerative change seen. No appreciable joint effusion. Prominent soft tissue swelling/edema noted, as indicated in the clinical data. IMPRESSION: Soft tissue swelling/edema. No osseous abnormality. No appreciable joint effusion. Electronically Signed   By: Bary Richard M.D.   On: 07/11/2016 19:38   I have personally reviewed and evaluated these images and lab results as part of my medical decision-making.   EKG Interpretation None     Patient's x-ray reviewed.  No joint effusion.  Soft tissue swelling.  Patient has an elevated white count of 19.1 with obvious cellulitis.  He will be started on IV antibiotic admitted for observation as there is no particular source MDM   Final diagnoses:  Cellulitis of left upper extremity         Earley Favor, NP 07/11/16 2359  Geoffery Lyons, MD 07/12/16 [phone]

## 2016-07-11 NOTE — ED Notes (Signed)
Tried IV stick x 1.

## 2016-07-11 NOTE — ED Notes (Addendum)
Pt here with c/o pain to left elbow and about 2 days ago he noticed his left elbow starting to swell. L elbow appears red and swollen and reports chills at home. Denies recent injury. No visible wounds.

## 2016-07-12 ENCOUNTER — Encounter: Payer: Self-pay | Admitting: Orthopedic Surgery

## 2016-07-12 ENCOUNTER — Inpatient Hospital Stay (HOSPITAL_COMMUNITY): Payer: Self-pay

## 2016-07-12 ENCOUNTER — Other Ambulatory Visit: Payer: Self-pay | Admitting: Orthopedic Surgery

## 2016-07-12 ENCOUNTER — Encounter (HOSPITAL_COMMUNITY): Payer: Self-pay | Admitting: *Deleted

## 2016-07-12 DIAGNOSIS — L03114 Cellulitis of left upper limb: Secondary | ICD-10-CM

## 2016-07-12 DIAGNOSIS — R609 Edema, unspecified: Secondary | ICD-10-CM

## 2016-07-12 LAB — BASIC METABOLIC PANEL
Anion gap: 9 (ref 5–15)
BUN: 7 mg/dL (ref 6–20)
CHLORIDE: 101 mmol/L (ref 101–111)
CO2: 25 mmol/L (ref 22–32)
CREATININE: 0.98 mg/dL (ref 0.61–1.24)
Calcium: 9.1 mg/dL (ref 8.9–10.3)
GFR calc Af Amer: >60 mL/min (ref >60)
GFR calc non Af Amer: >60 mL/min (ref >60)
GLUCOSE: 88 mg/dL (ref 65–99)
Potassium: 3.5 mmol/L (ref 3.5–5.1)
SODIUM: 135 mmol/L (ref 135–145)

## 2016-07-12 LAB — CBC
HCT: 40.1 % (ref 39.0–52.0)
Hemoglobin: 13.1 g/dL (ref 13.0–17.0)
MCH: 27.1 pg (ref 26.0–34.0)
MCHC: 32.7 g/dL (ref 30.0–36.0)
MCV: 82.9 fL (ref 78.0–100.0)
PLATELETS: 176 10*3/uL (ref 150–400)
RBC: 4.84 MIL/uL (ref 4.22–5.81)
RDW: 13.4 % (ref 11.5–15.5)
WBC: 18.4 10*3/uL — AB (ref 4.0–10.5)

## 2016-07-12 MED ORDER — SODIUM CHLORIDE 0.9 % IV SOLN
INTRAVENOUS | Status: AC
  Administered 2016-07-12 (×2): via INTRAVENOUS

## 2016-07-12 MED ORDER — TRAMADOL HCL 50 MG PO TABS
50.0000 mg | ORAL_TABLET | Freq: Four times a day (QID) | ORAL | Status: DC | PRN
  Administered 2016-07-12 – 2016-07-14 (×3): 50 mg via ORAL
  Filled 2016-07-12 (×3): qty 1

## 2016-07-12 MED ORDER — ACETAMINOPHEN 325 MG PO TABS: 650.0000 mg | ORAL_TABLET | Freq: Four times a day (QID) | ORAL | Status: DC | PRN

## 2016-07-12 MED ORDER — PIPERACILLIN-TAZOBACTAM 3.375 G IVPB
3.3750 g | Freq: Three times a day (TID) | INTRAVENOUS | Status: DC
  Administered 2016-07-12 – 2016-07-14 (×9): 3.375 g via INTRAVENOUS
  Filled 2016-07-12 (×11): qty 50

## 2016-07-12 MED ORDER — PNEUMOCOCCAL VAC POLYVALENT 25 MCG/0.5ML IJ INJ
0.5000 mL | INJECTION | INTRAMUSCULAR | Status: DC
  Filled 2016-07-12: qty 0.5

## 2016-07-12 MED ORDER — PIPERACILLIN-TAZOBACTAM 3.375 G IVPB 30 MIN
3.3750 g | Freq: Once | INTRAVENOUS | Status: AC
  Administered 2016-07-12: 3.375 g via INTRAVENOUS
  Filled 2016-07-12: qty 50

## 2016-07-12 MED ORDER — ONDANSETRON HCL 4 MG PO TABS: 4.0000 mg | ORAL_TABLET | Freq: Four times a day (QID) | ORAL | Status: DC | PRN

## 2016-07-12 MED ORDER — HYDROMORPHONE HCL 1 MG/ML IJ SOLN
1.0000 mg | INTRAMUSCULAR | Status: DC | PRN
  Administered 2016-07-12 – 2016-07-14 (×5): 1 mg via INTRAVENOUS
  Filled 2016-07-12 (×5): qty 1

## 2016-07-12 MED ORDER — ACETAMINOPHEN 650 MG RE SUPP: 650.0000 mg | Freq: Four times a day (QID) | RECTAL | Status: DC | PRN

## 2016-07-12 MED ORDER — VANCOMYCIN HCL IN DEXTROSE 1-5 GM/200ML-% IV SOLN
1000.0000 mg | Freq: Three times a day (TID) | INTRAVENOUS | Status: DC
  Administered 2016-07-12 – 2016-07-14 (×7): 1000 mg via INTRAVENOUS
  Filled 2016-07-12 (×8): qty 200

## 2016-07-12 MED ORDER — ONDANSETRON HCL 4 MG/2ML IJ SOLN: 4.0000 mg | Freq: Four times a day (QID) | INTRAMUSCULAR | Status: DC | PRN

## 2016-07-12 MED ORDER — VANCOMYCIN HCL IN DEXTROSE 1-5 GM/200ML-% IV SOLN
1000.0000 mg | Freq: Once | INTRAVENOUS | Status: AC
  Administered 2016-07-12: 1000 mg via INTRAVENOUS
  Filled 2016-07-12: qty 200

## 2016-07-12 NOTE — Progress Notes (Signed)
Preliminary results by tech - Left Upper Ext. Venous Duplex Completed. Negative for deep and superficial vein thrombosis in the left arm. Enlarged lymph node was noted in the arm pit area.  Marilynne Halstedita Shaolin Armas, BS, RDMS, RVT

## 2016-07-12 NOTE — Progress Notes (Signed)
Pt seen and examined, 24/M with no significant PMH admitted this am with LUE cellulitis and swelling Continue IV Vanc, Zosyn, cut down IVF MRI pending Elevate LUE D/w Pt and mom at bedside  Zannie CovePreetha Ojani Berenson, MD

## 2016-07-12 NOTE — Progress Notes (Signed)
NAME: Isaac Olson MRN:   [id number] DOB:   Nov 22, 1991   CHIEF COMPLAINT:  Left elbow pain  HISTORY:   Isaac Olson a 25 y.o. male  with left  Elbow pain Patient complains of myalgias, which have/has been present for 1 day. Pain is located in the left elbow(s). The pain is described as moderate, aching and dull. Associated symptoms include: decreased range of motion. The patient has tried OTC pain medications (tylenol) for pain, with minimal relief. Related to injury: no.    PAST MEDICAL HISTORY:   Past Medical History  Diagnosis Date  . ADHD (attention deficit hyperactivity disorder)   . Bulge of cervical disc without myelopathy   . Stomach ulcer     PAST SURGICAL HISTORY:  No past surgical history on file.  MEDICATIONS:   (Not in a hospital admission)  ALLERGIES:  No Known Allergies  REVIEW OF SYSTEMS:   Negative except left elbow pain  FAMILY HISTORY:   Family History  Problem Relation Age of Onset  . Diabetes Other   . Heart failure Other   . Diabetes Maternal Grandmother   . Depression Mother     SOCIAL HISTORY:   reports that he has been smoking Cigarettes.  He has been smoking about 0.10 packs per day. He does not have any smokeless tobacco history on file. He reports that he does not drink alcohol or use illicit drugs.  PHYSICAL EXAM:  General appearance: alert and cooperative Extremities: left elbow pain/swelling    LABORATORY STUDIES:  Recent Labs  07/11/16 1853 07/12/16 0609  WBC 19.1* 18.4*  HGB 13.8 13.1  HCT 42.7 40.1  PLT 175 176     Recent Labs  07/11/16 1853 07/12/16 0609  NA 135 135  K 3.6 3.5  CL 103 101  CO2 24 25  GLUCOSE 113* 88  BUN 8 7  CREATININE 1.00 0.98  CALCIUM 9.4 9.1    STUDIES/RESULTS:  Dg Elbow Complete Left  07/11/2016  CLINICAL DATA:  Left elbow pain and swelling. Left elbow appears red and swollen. Patient reports chills at home. Elbow and forearm are approximately twice the size of the contralateral arm.  EXAM: LEFT ELBOW - COMPLETE 3+ VIEW COMPARISON:  None FINDINGS: Four views of the left elbow are provided. Osseous alignment is normal. Bone mineralization is normal. No fracture line or displaced fracture fragment seen. No focal cortical irregularity or osseous lesion. No degenerative change seen. No appreciable joint effusion. Prominent soft tissue swelling/edema noted, as indicated in the clinical data. IMPRESSION: Soft tissue swelling/edema. No osseous abnormality. No appreciable joint effusion. Electronically Signed   By: Bary Richard M.D.   On: 07/11/2016 19:38   Mr Elbow Left Wo Contrast  07/12/2016  CLINICAL DATA:  Left upper extremity swelling and pain. EXAM: MRI OF THE LEFT ELBOW WITHOUT CONTRAST TECHNIQUE: Multiplanar, multisequence MR imaging of the elbow was performed. No intravenous contrast was administered. COMPARISON:  None. FINDINGS: TENDONS Common forearm flexor origin: Intact. Common forearm extensor origin: Intact. Biceps: Intact. Triceps: Intact. LIGAMENTS Medial stabilizers: Intact. Lateral stabilizers:  Intact. Cartilage: No chondral defect. Joint: No joint effusion. Cubital tunnel: Normal. Bones: No marrow signal abnormality.  No fracture or dislocation. Soft tissue: Diffuse circumferential soft tissue edema throughout the subcutaneous fat of the distal humerus and proximal forearm most severe posteriorly concerning for cellulitis. Ill-defined area of focal complex fluid overlying the olecranon process concerning for small hematoma or abscess measuring approximately 2 x 1 x 1.7 cm. Characterization is limited  secondary to lack of intravenous contrast. Mild soft tissue edema in the proximal extensor digitorum muscle likely reactive secondary to adjacent inflammation. IMPRESSION: 1. Diffuse circumferential soft tissue edema throughout the subcutaneous fat of the distal humerus and proximal forearm most severe posteriorly concerning for cellulitis. Ill-defined area of focal complex fluid  overlying the olecranon process concerning for small hematoma or abscess measuring approximately 2 x 1 x 1.7 cm. Sonographic guided aspiration may be helpful. Electronically Signed   By: Elige KoHetal  Patel   On: 07/12/2016 11:13    ASSESSMENT: left elbow cellulitis  PLAN: This is non-surgical. Continue IV antibiotics. Ortho signing off    Renda Pohlman,STEPHEN D 07/12/2016. 4:18 PM

## 2016-07-12 NOTE — Progress Notes (Signed)
Pt c/o pain at this time. It's too early for Ultram. Paged Dr Jomarie LongsJoseph. Waiting to hear back from dr. Stann MainlandWill continue to monitor.

## 2016-07-12 NOTE — H&P (Signed)
History and Physical    Isaac Olson ZOX:[id number] DOB: [birth year] DOA: 07/11/2016  PCP: No PCP Per Patient  Patient coming from: Home.  Chief Complaint: Left upper extremity swelling and pain.  HPI: Isaac Olson is a 25 y.o. male with no significant past medical history presents to the ER because of left upper extremity swelling and pain. Patient also has been having some fever and chills. Patient's symptoms started 3 days ago with pain and swelling involving the elbow area which has progressed both distally and proximally. At this time patient is able to flex his joints without difficulty. Patient has significant swelling of the entire arm most prominent around the middle. Denies any insect bites or trauma. X-rays reveal edema. Patient is being admitted for IV antibiotics therapy.   ED Course: Patient was started on IV antibiotics after blood cultures were obtained.  Review of Systems: As per HPI, rest all negative.   Past Medical History  Diagnosis Date  . ADHD (attention deficit hyperactivity disorder)   . Bulge of cervical disc without myelopathy   . Stomach ulcer     History reviewed. No pertinent past surgical history.   reports that he has been smoking Cigarettes.  He has been smoking about 0.10 packs per day. He does not have any smokeless tobacco history on file. He reports that he does not drink alcohol or use illicit drugs.  No Known Allergies  Family History  Problem Relation Age of Onset  . Diabetes Other   . Heart failure Other   . Diabetes Maternal Grandmother   . Depression Mother     Prior to Admission medications   Medication Sig Start Date End Date Taking? Authorizing Provider  traMADol (ULTRAM) 50 MG tablet Take 1 tablet (50 mg total) by mouth every 6 (six) hours as needed. 05/28/16  Yes Tiffany Neva SeatGreene, PA-C  cyclobenzaprine (FLEXERIL) 10 MG tablet Take 0.5-1 tablets (5-10 mg total) by mouth 2 (two) times daily as needed. Patient not taking: Reported  on 07/12/2016 05/28/16   Marlon Peliffany Greene, PA-C  predniSONE (DELTASONE) 20 MG tablet 3 by mouth for 3 days, then 2 by mouth for 2 days, then 1 by mouth for 2 days, then 1/2 by mouth for 2 days. Patient not taking: Reported on 07/12/2016 05/28/16   Marlon Peliffany Greene, PA-C  terbinafine (LAMISIL AT) 1 % cream Apply 1 application topically 2 (two) times daily. Patient not taking: Reported on 07/12/2016 10/14/14   Lemont Fillersatyana Kirichenko, PA-C  triamcinolone cream (KENALOG) 0.1 % Apply 1 application topically 3 (three) times daily as needed. Patient not taking: Reported on 07/12/2016 06/18/15   Shade FloodJeffrey R Greene, MD    Physical Exam: Filed Vitals:   07/11/16 2330 07/11/16 2345 07/12/16 0000 07/12/16 0044  BP: 125/77   118/64  Pulse:  93  99  Temp:    98.3 F (36.8 C)  TempSrc:      Resp:    19  Height:      Weight:   239 lb 13.8 oz (108.8 kg)   SpO2:  96%  99%      Constitutional: Not in distress. Filed Vitals:   07/11/16 2330 07/11/16 2345 07/12/16 0000 07/12/16 0044  BP: 125/77   118/64  Pulse:  93  99  Temp:    98.3 F (36.8 C)  TempSrc:      Resp:    19  Height:      Weight:   239 lb 13.8 oz (108.8 kg)   SpO2:  96%  99%   Eyes: Anicteric no pallor. ENMT: No discharge from the ears eyes nose and mouth. Neck: No mass felt. Respiratory: No rhonchi or crepitations. Cardiovascular: S1-S2 heard. Abdomen: Soft nontender bowel sounds present. Musculoskeletal: Entire left upper extremity is edematous with most prominent in the midsection. Patient is able to move and flex all joints. Skin: Mild erythema of the left upper extremity. Neurologic: Alert awake oriented to time place and person. Moves all extremities. Psychiatric: Appears normal.   Labs on Admission: I have personally reviewed following labs and imaging studies  CBC:  Recent Labs Lab 07/11/16 1853  WBC 19.1*  NEUTROABS 15.7*  HGB 13.8  HCT 42.7  MCV 83.2  PLT 175   Basic Metabolic Panel:  Recent Labs Lab 07/11/16 1853    NA 135  K 3.6  CL 103  CO2 24  GLUCOSE 113*  BUN 8  CREATININE 1.00  CALCIUM 9.4   GFR: Estimated Creatinine Clearance: 137.2 mL/min (by C-G formula based on Cr of 1). Liver Function Tests:  Recent Labs Lab 07/11/16 1853  AST 24  ALT 23  ALKPHOS 77  BILITOT 1.1  PROT 7.8  ALBUMIN 4.0   No results for input(s): LIPASE, AMYLASE in the last 168 hours. No results for input(s): AMMONIA in the last 168 hours. Coagulation Profile: No results for input(s): INR, PROTIME in the last 168 hours. Cardiac Enzymes: No results for input(s): CKTOTAL, CKMB, CKMBINDEX, TROPONINI in the last 168 hours. BNP (last 3 results) No results for input(s): PROBNP in the last 8760 hours. HbA1C: No results for input(s): HGBA1C in the last 72 hours. CBG: No results for input(s): GLUCAP in the last 168 hours. Lipid Profile: No results for input(s): CHOL, HDL, LDLCALC, TRIG, CHOLHDL, LDLDIRECT in the last 72 hours. Thyroid Function Tests: No results for input(s): TSH, T4TOTAL, FREET4, T3FREE, THYROIDAB in the last 72 hours. Anemia Panel: No results for input(s): VITAMINB12, FOLATE, FERRITIN, TIBC, IRON, RETICCTPCT in the last 72 hours. Urine analysis:    Component Value Date/Time   COLORURINE YELLOW 05/28/2016 0316   APPEARANCEUR CLEAR 05/28/2016 0316   LABSPEC 1.007 05/28/2016 0316   PHURINE 6.0 05/28/2016 0316   GLUCOSEU NEGATIVE 05/28/2016 0316   HGBUR TRACE* 05/28/2016 0316   BILIRUBINUR NEGATIVE 05/28/2016 0316   KETONESUR NEGATIVE 05/28/2016 0316   PROTEINUR NEGATIVE 05/28/2016 0316   NITRITE NEGATIVE 05/28/2016 0316   LEUKOCYTESUR NEGATIVE 05/28/2016 0316   Sepsis Labs: @LABRCNTIP (procalcitonin:4,lacticidven:4) )No results found for this or any previous visit (from the past 240 hour(s)).   Radiological Exams on Admission: Dg Elbow Complete Left  07/11/2016  CLINICAL DATA:  Left elbow pain and swelling. Left elbow appears red and swollen. Patient reports chills at home. Elbow and  forearm are approximately twice the size of the contralateral arm. EXAM: LEFT ELBOW - COMPLETE 3+ VIEW COMPARISON:  None FINDINGS: Four views of the left elbow are provided. Osseous alignment is normal. Bone mineralization is normal. No fracture line or displaced fracture fragment seen. No focal cortical irregularity or osseous lesion. No degenerative change seen. No appreciable joint effusion. Prominent soft tissue swelling/edema noted, as indicated in the clinical data. IMPRESSION: Soft tissue swelling/edema. No osseous abnormality. No appreciable joint effusion. Electronically Signed   By: Bary Richard M.D.   On: 07/11/2016 19:38      Assessment/Plan Principal Problem:   Cellulitis of left upper extremity Active Problems:   Cellulitis    1. Left upper extremity cellulitis - patient has significant cellulitis with swelling. I  have ordered MRI of the left elbow since swelling is most prominent in the midsection to check for any abscess. Patient does not have any definite signs of compartment syndrome at this time. Closely observe clinically. I have placed patient on vancomycin and Zosyn. Keep left upper extremity elevated. Follow blood cultures. Check Dopplers.   DVT prophylaxis: SCDs and patient of procedure. Code Status: Full code.  Family Communication: Patient's mother.  Disposition Plan: Home.  Consults called: None.  Admission status: Inpatient. MedSurg. Likely stay 2-3 days.    Eduard Clos MD [facility] Pager [phone].  If 7PM-7AM, please contact night-coverage [email or web address] Password [facility]  07/12/2016, 12:49 AM

## 2016-07-12 NOTE — Progress Notes (Signed)
NAME: Isaac Olson Better MRN:   [id number] DOB:   [birth year]   CHIEF COMPLAINT:  Left elbow pain  HISTORY:   Isaac Olson a 25 y.o. male  with left  Elbow Pain Patient complains of left elbow pain. Onset of the symptoms was several days ago. Inciting event: none known. Current symptoms include: swelling. Pain is aggravated by: movement. Symptoms have stabilized. Patient has had no prior elbow problems. Evaluation to date: xrays, mri scan. Treatment to date: iv antibiotics. PAST MEDICAL HISTORY:   Past Medical History  Diagnosis Date  . ADHD (attention deficit hyperactivity disorder)   . Bulge of cervical disc without myelopathy   . Stomach ulcer     PAST SURGICAL HISTORY:  No past surgical history on file.  MEDICATIONS:   (Not in a hospital admission)  ALLERGIES:  No Known Allergies  REVIEW OF SYSTEMS:   Negative except left elbow pain  FAMILY HISTORY:   Family History  Problem Relation Age of Onset  . Diabetes Other   . Heart failure Other   . Diabetes Maternal Grandmother   . Depression Mother     SOCIAL HISTORY:   reports that he has been smoking Cigarettes.  He has been smoking about 0.10 packs per day. He does not have any smokeless tobacco history on file. He reports that he does not drink alcohol or use illicit drugs.  PHYSICAL EXAM:  General appearance: alert and cooperative Extremities: swollen left elbow    LABORATORY STUDIES:  Recent Labs  07/11/16 1853 07/12/16 0609  WBC 19.1* 18.4*  HGB 13.8 13.1  HCT 42.7 40.1  PLT 175 176     Recent Labs  07/11/16 1853 07/12/16 0609  NA 135 135  K 3.6 3.5  CL 103 101  CO2 24 25  GLUCOSE 113* 88  BUN 8 7  CREATININE 1.00 0.98  CALCIUM 9.4 9.1    STUDIES/RESULTS:  Dg Elbow Complete Left  07/11/2016  CLINICAL DATA:  Left elbow pain and swelling. Left elbow appears red and swollen. Patient reports chills at home. Elbow and forearm are approximately twice the size of the contralateral arm. EXAM: LEFT ELBOW -  COMPLETE 3+ VIEW COMPARISON:  None FINDINGS: Four views of the left elbow are provided. Osseous alignment is normal. Bone mineralization is normal. No fracture line or displaced fracture fragment seen. No focal cortical irregularity or osseous lesion. No degenerative change seen. No appreciable joint effusion. Prominent soft tissue swelling/edema noted, as indicated in the clinical data. IMPRESSION: Soft tissue swelling/edema. No osseous abnormality. No appreciable joint effusion. Electronically Signed   By: Bary RichardStan  Maynard M.Olson.   On: 07/11/2016 19:38   Isaac Olson  07/12/2016  CLINICAL DATA:  Left upper extremity swelling and pain. EXAM: MRI OF THE LEFT ELBOW WITHOUT Olson TECHNIQUE: Multiplanar, multisequence Isaac imaging of the elbow was performed. No intravenous Olson was administered. COMPARISON:  None. FINDINGS: TENDONS Common forearm flexor origin: Intact. Common forearm extensor origin: Intact. Biceps: Intact. Triceps: Intact. LIGAMENTS Medial stabilizers: Intact. Lateral stabilizers:  Intact. Cartilage: No chondral defect. Joint: No joint effusion. Cubital tunnel: Normal. Bones: No marrow signal abnormality.  No fracture or dislocation. Soft tissue: Diffuse circumferential soft tissue edema throughout the subcutaneous fat of the distal humerus and proximal forearm most severe posteriorly concerning for cellulitis. Ill-defined area of focal complex fluid overlying the olecranon process concerning for small hematoma or abscess measuring approximately 2 x 1 x 1.7 cm. Characterization is limited secondary to lack of intravenous Olson.  Mild soft tissue edema in the proximal extensor digitorum muscle likely reactive secondary to adjacent inflammation. IMPRESSION: 1. Diffuse circumferential soft tissue edema throughout the subcutaneous fat of the distal humerus and proximal forearm most severe posteriorly concerning for cellulitis. Ill-defined area of focal complex fluid overlying the  olecranon process concerning for small hematoma or abscess measuring approximately 2 x 1 x 1.7 cm. Sonographic guided aspiration may be helpful. Electronically Signed   By: Elige Ko   On: 07/12/2016 11:13    ASSESSMENT: cellulitis right elbow  PLAN: no surgery needed Continue IV antibiotics Ortho signing off    Isaac Olson,Isaac Olson 07/12/2016. 5:33 PM

## 2016-07-12 NOTE — Progress Notes (Signed)
Pharmacy Antibiotic Note  Rockey SituDameon D Siravo is a 25 y.o. male admitted on 07/11/2016 with cellulitis.  Pharmacy has been consulted for Vancocin and Zosyn dosing.  Plan: Vancomycin 2g x1 then 1g IV every 8 hours.  Goal trough 10-15 mcg/mL. Zosyn 3.375g IV q8h (4 hour infusion).  Height: 5\' 9"  (175.3 cm) Weight: 239 lb 13.8 oz (108.8 kg) IBW/kg (Calculated) : 70.7  Temp (24hrs), Avg:99.1 F (37.3 C), Min:98.3 F (36.8 C), Max:99.8 F (37.7 C)   Recent Labs Lab 07/11/16 1853 07/11/16 1911  WBC 19.1*  --   CREATININE 1.00  --   LATICACIDVEN  --  0.85    Estimated Creatinine Clearance: 137.2 mL/min (by C-G formula based on Cr of 1).    No Known Allergies   Thank you for allowing pharmacy to be a part of this patient's care.  Vernard GamblesVeronda Travon Crochet, PharmD, BCPS  07/12/2016 12:52 AM

## 2016-07-13 DIAGNOSIS — L03114 Cellulitis of left upper limb: Principal | ICD-10-CM

## 2016-07-13 LAB — BASIC METABOLIC PANEL
ANION GAP: 7 (ref 5–15)
BUN: 7 mg/dL (ref 6–20)
CHLORIDE: 100 mmol/L — AB (ref 101–111)
CO2: 27 mmol/L (ref 22–32)
Calcium: 9.2 mg/dL (ref 8.9–10.3)
Creatinine, Ser: 1.09 mg/dL (ref 0.61–1.24)
GFR calc non Af Amer: >60 mL/min (ref >60)
GLUCOSE: 95 mg/dL (ref 65–99)
POTASSIUM: 3.8 mmol/L (ref 3.5–5.1)
Sodium: 134 mmol/L — ABNORMAL LOW (ref 135–145)

## 2016-07-13 NOTE — Care Management Note (Signed)
Case Management Note  Patient Details  Name: Isaac Olson MRN: [id number] Date of Birth: May 09, 1991  Subjective/Objective:                 Spoke with patient in the room. 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 He states he has some insurance through [employer], but does [employer] medical insurance at this time. Patient would like to follow up at [facility] to establish PCP. [facility] with phone number. CM explained to patient that they may use the on site pharmacy to fill prescriptions given to them at discharge. Patient aware that [facility] will not fill narcotics or pain medications prior to the patient being seen by one of their physicians.  Patient aware that they must be seen as a patient prior to the pharmacy filling the prescriptions a second time.    Action/Plan:  F/U w/ [facility]/ [facility] Expected Discharge Date:                  Expected Discharge Plan:  Home/Self Care  In-House Referral:     Discharge planning Services  CM Consult, [facility]  Post Acute Care Choice:    Choice offered to:     DME Arranged:    DME Agency:     HH Arranged:    HH Agency:     Status of Service:  In process, will continue to follow  If discussed at Long Length of Stay Meetings, dates discussed:    Additional Comments:  Lawerance SabalDebbie Letesha Klecker, RN 07/13/2016, 10:08 AM

## 2016-07-13 NOTE — Progress Notes (Signed)
[facility]                                                                              Patient Demographics  Isaac Olson, is a 25 y.o. male, DOB - Feb 13, 1991, ZOX:[id number]  Admit date - 07/11/2016   Admitting Physician Eduard Clos, MD  Outpatient Primary MD for the patient is No PCP Per Patient  Outpatient specialists:   LOS - 1  days    Chief Complaint  Patient presents with  . Arm Swelling       Brief summary   Patient is a 25 year old male with no significant medical history presented to ED withleft upper extremity swelling and pain. Patient also has been having some fever and chills. Patient's symptoms started 3 days ago with pain and swelling involving the elbow area which has progressed both distally and proximally. At the time of admission, patient was able to flex his joints without difficulty.  Patient has significant swelling of the entire arm most prominent around the middle. Denies any insect bites or trauma. X-rays reveal edema, patient was admitted for further workup.   Assessment & Plan    Principal Problem:  Left Cellulitis of left upper extremity - MRI showed diffuse circumferential soft tissue edema consistent with cellulitis, ill-defined area of focal complex fluid overlying the olecranon process concerning for small hematoma or abscess 2x1x1.7cm - No DVT - Continue IV vancomycin and Zosyn - Per patient, no significant improvement, orthopedics was consulted on 7/18, patient seen by Dr. Valentina Gu who did not feel surgery is needed -  will continue IV antibiotics today and reassess in next 24 hours   Code Status: Full CODE STATUS DVT Prophylaxis:   SCD's Family Communication: Discussed in detail with the patient, all imaging results, lab results explained to the patient and patient's mother at the bedside  Disposition Plan:   Time Spent in minutes   25 minutes  Procedures:  MRI  Doppler ultrasound of the left upper extremity    Consultants:   Orthopedics  Antimicrobials:   IV vancomycin 7/18  IV Zosyn   Medications  Scheduled Meds: . piperacillin-tazobactam (ZOSYN)  IV  3.375 g Intravenous Q8H  . pneumococcal 23 valent vaccine  0.5 mL Intramuscular Tomorrow-1000  . vancomycin  1,000 mg Intravenous Q8H   Continuous Infusions:  PRN Meds:.acetaminophen **OR** acetaminophen, HYDROmorphone (DILAUDID) injection, ondansetron **OR** ondansetron (ZOFRAN) IV, traMADol   Antibiotics   Anti-infectives    Start     Dose/Rate Route Frequency Ordered Stop   07/12/16 0800  vancomycin (VANCOCIN) IVPB 1000 mg/200 mL premix     1,000 mg 200 mL/hr over 60 Minutes Intravenous Every 8 hours 07/12/16 0054     07/12/16 0800  piperacillin-tazobactam (ZOSYN) IVPB 3.375 g     3.375 g 12.5 mL/hr over 240 Minutes Intravenous Every 8 hours 07/12/16 0054     07/12/16 0100  vancomycin (VANCOCIN) IVPB 1000 mg/200 mL premix     1,000 mg 200 mL/hr over 60 Minutes Intravenous  Once 07/12/16 0054 07/12/16 0228   07/12/16 0100  piperacillin-tazobactam (ZOSYN) IVPB 3.375 g     3.375 g 100 mL/hr over  30 Minutes Intravenous  Once 07/12/16 0054 07/12/16 0157   07/11/16 2300  vancomycin (VANCOCIN) IVPB 1000 mg/200 mL premix     1,000 mg 200 mL/hr over 60 Minutes Intravenous  Once 07/11/16 2256 07/12/16 0055        Subjective:   Isaac Olson was seen and examined today. Patient reporting no significant improvement in the left arm cellulitis. Overnight low-grade fever of 62F. no nausea, vomiting. Patient denies dizziness, chest pain, shortness of breath, abdominal pain,  new weakness, numbess, tingling. No acute events overnight.    Objective:   Filed Vitals:   07/12/16 1459 07/12/16 2108 07/12/16 2157 07/13/16 0608  BP: 116/51 98/54 116/60 109/58  Pulse: 90 90 86 86  Temp: 98.3 F (36.8 C) 99 F (37.2 C)  98.9 F (37.2 C)  TempSrc:  Oral  Oral  Resp: 18 17  16   Height:      Weight:      SpO2: 97% 98%  98%     Intake/Output Summary (Last 24 hours) at 07/13/16 1206 Last data filed at 07/13/16 0841  Gross per 24 hour  Intake 1321.17 ml  Output    600 ml  Net 721.17 ml     Wt Readings from Last 3 Encounters:  07/12/16 108.8 kg (239 lb 13.8 oz)  06/18/15 108.41 kg (239 lb)  03/20/15 99.791 kg (220 lb)     Exam  General: Alert and oriented x 3, NAD  HEENT:  PERRLA, EOMI, Anicteric Sclera, mucous membranes moist.   Neck: Supple, no JVD, no masses  Cardiovascular: S1 S2 auscultated, no rubs, murmurs or gallops. Regular rate and rhythm.  Respiratory: Clear to auscultation bilaterally, no wheezing, rales or rhonchi  Gastrointestinal: Soft, nontender, nondistended, + bowel sounds  Ext: no cyanosis clubbing or edema  Neuro: AAOx3, Cr N's II- XII. Strength 5/5 upper and lower extremities bilaterally  Skin: Left upper external edema, prominent in the mid section below the elbow, able to flex the joint, feels tenderness  Psych: Normal affect and demeanor, alert and oriented x3    Data Reviewed:  I have personally reviewed following labs and imaging studies  Micro Results No results found for this or any previous visit (from the past 240 hour(s)).  Radiology Reports Dg Elbow Complete Left  07/11/2016  CLINICAL DATA:  Left elbow pain and swelling. Left elbow appears red and swollen. Patient reports chills at home. Elbow and forearm are approximately twice the size of the contralateral arm. EXAM: LEFT ELBOW - COMPLETE 3+ VIEW COMPARISON:  None FINDINGS: Four views of the left elbow are provided. Osseous alignment is normal. Bone mineralization is normal. No fracture line or displaced fracture fragment seen. No focal cortical irregularity or osseous lesion. No degenerative change seen. No appreciable joint effusion. Prominent soft tissue swelling/edema noted, as indicated in the clinical data. IMPRESSION: Soft tissue swelling/edema. No osseous abnormality. No appreciable joint effusion.  Electronically Signed   By: Bary Richard M.D.   On: 07/11/2016 19:38   Mr Elbow Left Wo Contrast  07/12/2016  CLINICAL DATA:  Left upper extremity swelling and pain. EXAM: MRI OF THE LEFT ELBOW WITHOUT CONTRAST TECHNIQUE: Multiplanar, multisequence MR imaging of the elbow was performed. No intravenous contrast was administered. COMPARISON:  None. FINDINGS: TENDONS Common forearm flexor origin: Intact. Common forearm extensor origin: Intact. Biceps: Intact. Triceps: Intact. LIGAMENTS Medial stabilizers: Intact. Lateral stabilizers:  Intact. Cartilage: No chondral defect. Joint: No joint effusion. Cubital tunnel: Normal. Bones: No marrow signal abnormality.  No fracture  or dislocation. Soft tissue: Diffuse circumferential soft tissue edema throughout the subcutaneous fat of the distal humerus and proximal forearm most severe posteriorly concerning for cellulitis. Ill-defined area of focal complex fluid overlying the olecranon process concerning for small hematoma or abscess measuring approximately 2 x 1 x 1.7 cm. Characterization is limited secondary to lack of intravenous contrast. Mild soft tissue edema in the proximal extensor digitorum muscle likely reactive secondary to adjacent inflammation. IMPRESSION: 1. Diffuse circumferential soft tissue edema throughout the subcutaneous fat of the distal humerus and proximal forearm most severe posteriorly concerning for cellulitis. Ill-defined area of focal complex fluid overlying the olecranon process concerning for small hematoma or abscess measuring approximately 2 x 1 x 1.7 cm. Sonographic guided aspiration may be helpful. Electronically Signed   By: Elige KoHetal  Patel   On: 07/12/2016 11:13    Lab Data:  CBC:  Recent Labs Lab 07/11/16 1853 07/12/16 0609  WBC 19.1* 18.4*  NEUTROABS 15.7*  --   HGB 13.8 13.1  HCT 42.7 40.1  MCV 83.2 82.9  PLT 175 176   Basic Metabolic Panel:  Recent Labs Lab 07/11/16 1853 07/12/16 0609 07/13/16 0618  NA 135 135  134*  K 3.6 3.5 3.8  CL 103 101 100*  CO2 24 25 27   GLUCOSE 113* 88 95  BUN 8 7 7   CREATININE 1.00 0.98 1.09  CALCIUM 9.4 9.1 9.2   GFR: Estimated Creatinine Clearance: 125.9 mL/min (by C-G formula based on Cr of 1.09). Liver Function Tests:  Recent Labs Lab 07/11/16 1853  AST 24  ALT 23  ALKPHOS 77  BILITOT 1.1  PROT 7.8  ALBUMIN 4.0   No results for input(s): LIPASE, AMYLASE in the last 168 hours. No results for input(s): AMMONIA in the last 168 hours. Coagulation Profile: No results for input(s): INR, PROTIME in the last 168 hours. Cardiac Enzymes: No results for input(s): CKTOTAL, CKMB, CKMBINDEX, TROPONINI in the last 168 hours. BNP (last 3 results) No results for input(s): PROBNP in the last 8760 hours. HbA1C: No results for input(s): HGBA1C in the last 72 hours. CBG: No results for input(s): GLUCAP in the last 168 hours. Lipid Profile: No results for input(s): CHOL, HDL, LDLCALC, TRIG, CHOLHDL, LDLDIRECT in the last 72 hours. Thyroid Function Tests: No results for input(s): TSH, T4TOTAL, FREET4, T3FREE, THYROIDAB in the last 72 hours. Anemia Panel: No results for input(s): VITAMINB12, FOLATE, FERRITIN, TIBC, IRON, RETICCTPCT in the last 72 hours. Urine analysis:    Component Value Date/Time   COLORURINE YELLOW 05/28/2016 0316   APPEARANCEUR CLEAR 05/28/2016 0316   LABSPEC 1.007 05/28/2016 0316   PHURINE 6.0 05/28/2016 0316   GLUCOSEU NEGATIVE 05/28/2016 0316   HGBUR TRACE* 05/28/2016 0316   BILIRUBINUR NEGATIVE 05/28/2016 0316   KETONESUR NEGATIVE 05/28/2016 0316   PROTEINUR NEGATIVE 05/28/2016 0316   NITRITE NEGATIVE 05/28/2016 0316   LEUKOCYTESUR NEGATIVE 05/28/2016 0316     Graylee Arutyunyan M.D. [facility] 07/13/2016, 12:06 PM  Pager: [phone] Between 7am to 7pm - call Pager - [phone]  After 7pm go to [email or web address] - password [email or web address]  Call night coverage person covering after 7pm

## 2016-07-14 LAB — BASIC METABOLIC PANEL
ANION GAP: 6 (ref 5–15)
BUN: 6 mg/dL (ref 6–20)
CALCIUM: 9.1 mg/dL (ref 8.9–10.3)
CO2: 27 mmol/L (ref 22–32)
CREATININE: 0.96 mg/dL (ref 0.61–1.24)
Chloride: 103 mmol/L (ref 101–111)
GFR calc Af Amer: >60 mL/min (ref >60)
GFR calc non Af Amer: >60 mL/min (ref >60)
Glucose, Bld: 98 mg/dL (ref 65–99)
Potassium: 3.7 mmol/L (ref 3.5–5.1)
Sodium: 136 mmol/L (ref 135–145)

## 2016-07-14 LAB — VANCOMYCIN, TROUGH: VANCOMYCIN TR: 9 ug/mL — AB (ref 15–20)

## 2016-07-14 MED ORDER — VANCOMYCIN HCL 10 G IV SOLR
1250.0000 mg | Freq: Three times a day (TID) | INTRAVENOUS | Status: DC
Start: 2016-07-14 — End: 2016-07-15
  Administered 2016-07-14 (×2): 1250 mg via INTRAVENOUS
  Filled 2016-07-14 (×4): qty 1250

## 2016-07-14 NOTE — Progress Notes (Signed)
[facility]                                                                              Patient Demographics  Isaac Olson, is a 25 y.o. male, DOB - [birth year], ZOX:[id number]  Admit date - 07/11/2016   Admitting Physician Eduard ClosArshad N Kakrakandy, MD  Outpatient Primary MD for the patient is No PCP Per Patient  Outpatient specialists:   LOS - 2  days    Chief Complaint  Patient presents with  . Arm Swelling       Brief summary   Patient is a [age] year old male with no significant medical history presented to ED withleft upper extremity swelling and pain. Patient also has been having some fever and chills. Patient's symptoms started 3 days ago with pain and swelling involving the elbow area which has progressed both distally and proximally. At the time of admission, patient was able to flex his joints without difficulty.  Patient has significant swelling of the entire arm most prominent around the middle. Denies any insect bites or trauma. X-rays reveal edema, patient was admitted for further workup.   Assessment & Plan    Principal Problem:  Left Cellulitis of left upper extremity-Improving - MRI showed diffuse circumferential soft tissue edema consistent with cellulitis, ill-defined area of focal complex fluid overlying the olecranon process concerning for small hematoma or abscess 2x1x1.7cm - No DVTOn Doppler ultrasound - Continue IV vancomycin and Zosyn, elevate arm - orthopedics was consulted on 7/18, patient seen by Dr. Valentina GuLucy who did not feel surgery is needed -  Overall improving, do not think, patient will require any surgery. Will continue IV antibiotics for another 24 hours and transition to oral antibiotics tomorrow   Code Status: Full CODE STATUS DVT Prophylaxis:   SCD's Family Communication: Discussed in detail with the patient, all imaging results, lab results explained to the patient and patient's father at the bedside  Disposition Plan: Likely DC  home tomorrow if cellulitis improving  Time Spent in minutes   25 minutes  Procedures:  MRI  Doppler ultrasound of the left upper extremity   Consultants:   Orthopedics  Antimicrobials:   IV vancomycin 7/18  IV Zosyn   Medications  Scheduled Meds: . piperacillin-tazobactam (ZOSYN)  IV  3.375 g Intravenous Q8H  . pneumococcal 23 valent vaccine  0.5 mL Intramuscular Tomorrow-1000  . vancomycin  1,250 mg Intravenous Q8H   Continuous Infusions:  PRN Meds:.acetaminophen **OR** acetaminophen, HYDROmorphone (DILAUDID) injection, ondansetron **OR** ondansetron (ZOFRAN) IV, traMADol   Antibiotics   Anti-infectives    Start     Dose/Rate Route Frequency Ordered Stop   07/14/16 1600  vancomycin (VANCOCIN) 1,250 mg in sodium chloride 0.9 % 250 mL IVPB     1,250 mg 166.7 mL/hr over 90 Minutes Intravenous Every 8 hours 07/14/16 0911     07/12/16 0800  vancomycin (VANCOCIN) IVPB 1000 mg/200 mL premix  Status:  Discontinued     1,000 mg 200 mL/hr over 60 Minutes Intravenous Every 8 hours 07/12/16 0054 07/14/16 0911   07/12/16 0800  piperacillin-tazobactam (ZOSYN) IVPB 3.375 g     3.375 g 12.5 mL/hr over 240  Minutes Intravenous Every 8 hours 07/12/16 0054     07/12/16 0100  vancomycin (VANCOCIN) IVPB 1000 mg/200 mL premix     1,000 mg 200 mL/hr over 60 Minutes Intravenous  Once 07/12/16 0054 07/12/16 0228   07/12/16 0100  piperacillin-tazobactam (ZOSYN) IVPB 3.375 g     3.375 g 100 mL/hr over 30 Minutes Intravenous  Once 07/12/16 0054 07/12/16 0157   07/11/16 2300  vancomycin (VANCOCIN) IVPB 1000 mg/200 mL premix     1,000 mg 200 mL/hr over 60 Minutes Intravenous  Once 07/11/16 2256 07/12/16 0055        Subjective:   Isaac Olson was seen and examined today. Overall improving cellulitis, able to bend arm, no fevers or chills. Small blister on the elbow.  Patient denies dizziness, chest pain, shortness of breath, abdominal pain,  new weakness, numbess, tingling. No acute events  overnight.    Objective:   Filed Vitals:   07/13/16 0608 07/13/16 1309 07/13/16 2124 07/14/16 0558  BP: 109/58 116/77 134/64 125/65  Pulse: 86 100 79 67  Temp: 98.9 F (37.2 C) 98.8 F (37.1 C) 98.6 F (37 C) 99 F (37.2 C)  TempSrc: Oral Oral Oral Oral  Resp: 16 18 18 18   Height:      Weight:      SpO2: 98% 99% 98% 98%    Intake/Output Summary (Last 24 hours) at 07/14/16 1235 Last data filed at 07/14/16 0644  Gross per 24 hour  Intake    610 ml  Output    300 ml  Net    310 ml     Wt Readings from Last 3 Encounters:  07/12/16 108.8 kg (239 lb 13.8 oz)  06/18/15 108.41 kg (239 lb)  03/20/15 99.791 kg (220 lb)     Exam  General: Alert and oriented x 3, NAD  HEENT:   Neck:   Cardiovascular: S1 S2 auscultated, no rubs, murmurs or gallops. Regular rate and rhythm.  Respiratory: Clear to auscultation bilaterally, no wheezing, rales or rhonchi  Gastrointestinal: Soft, nontender, nondistended, + bowel sounds  Ext: no cyanosis clubbing or edema  Neuro: No new deficits  Skin: Left upper external edema, prominent in the mid section below the elbow, able to flex the joint, Overall improving  Psych: Normal affect and demeanor, alert and oriented x3    Data Reviewed:  I have personally reviewed following labs and imaging studies  Micro Results Recent Results (from the past 240 hour(s))  Culture, blood (Routine X 2) w Reflex to ID Panel     Status: None (Preliminary result)   Collection Time: 07/11/16 11:25 PM  Result Value Ref Range Status   Specimen Description BLOOD RIGHT ARM  Final   Special Requests IN PEDIATRIC BOTTLE  Final   Culture NO GROWTH 1 DAY  Final   Report Status PENDING  Incomplete  Culture, blood (Routine X 2) w Reflex to ID Panel     Status: None (Preliminary result)   Collection Time: 07/11/16 11:45 PM  Result Value Ref Range Status   Specimen Description BLOOD RIGHT ARM  Final   Special Requests BOTTLES DRAWN AEROBIC AND ANAEROBIC   Final   Culture NO GROWTH 1 DAY  Final   Report Status PENDING  Incomplete    Radiology Reports Dg Elbow Complete Left  07/11/2016  CLINICAL DATA:  Left elbow pain and swelling. Left elbow appears red and swollen. Patient reports chills at home. Elbow and forearm are approximately twice the size of the  contralateral arm. EXAM: LEFT ELBOW - COMPLETE 3+ VIEW COMPARISON:  None FINDINGS: Four views of the left elbow are provided. Osseous alignment is normal. Bone mineralization is normal. No fracture line or displaced fracture fragment seen. No focal cortical irregularity or osseous lesion. No degenerative change seen. No appreciable joint effusion. Prominent soft tissue swelling/edema noted, as indicated in the clinical data. IMPRESSION: Soft tissue swelling/edema. No osseous abnormality. No appreciable joint effusion. Electronically Signed   By: Bary Richard M.D.   On: 07/11/2016 19:38   Mr Elbow Left Wo Contrast  07/12/2016  CLINICAL DATA:  Left upper extremity swelling and pain. EXAM: MRI OF THE LEFT ELBOW WITHOUT CONTRAST TECHNIQUE: Multiplanar, multisequence MR imaging of the elbow was performed. No intravenous contrast was administered. COMPARISON:  None. FINDINGS: TENDONS Common forearm flexor origin: Intact. Common forearm extensor origin: Intact. Biceps: Intact. Triceps: Intact. LIGAMENTS Medial stabilizers: Intact. Lateral stabilizers:  Intact. Cartilage: No chondral defect. Joint: No joint effusion. Cubital tunnel: Normal. Bones: No marrow signal abnormality.  No fracture or dislocation. Soft tissue: Diffuse circumferential soft tissue edema throughout the subcutaneous fat of the distal humerus and proximal forearm most severe posteriorly concerning for cellulitis. Ill-defined area of focal complex fluid overlying the olecranon process concerning for small hematoma or abscess measuring approximately 2 x 1 x 1.7 cm. Characterization is limited secondary to lack of intravenous contrast. Mild  soft tissue edema in the proximal extensor digitorum muscle likely reactive secondary to adjacent inflammation. IMPRESSION: 1. Diffuse circumferential soft tissue edema throughout the subcutaneous fat of the distal humerus and proximal forearm most severe posteriorly concerning for cellulitis. Ill-defined area of focal complex fluid overlying the olecranon process concerning for small hematoma or abscess measuring approximately 2 x 1 x 1.7 cm. Sonographic guided aspiration may be helpful. Electronically Signed   By: Elige Ko   On: 07/12/2016 11:13    Lab Data:  CBC:  Recent Labs Lab 07/11/16 1853 07/12/16 0609  WBC 19.1* 18.4*  NEUTROABS 15.7*  --   HGB 13.8 13.1  HCT 42.7 40.1  MCV 83.2 82.9  PLT 175 176   Basic Metabolic Panel:  Recent Labs Lab 07/11/16 1853 07/12/16 0609 07/13/16 0618 07/14/16 0737  NA 135 135 134* 136  K 3.6 3.5 3.8 3.7  CL 103 101 100* 103  CO2 GLUCOSE 113* 88 95 98  BUN CREATININE 1.00 0.98 1.09 0.96  CALCIUM 9.4 9.1 9.2 9.1   GFR: Estimated Creatinine Clearance: 142.9 mL/min (by C-G formula based on Cr of 0.96). Liver Function Tests:  Recent Labs Lab 07/11/16 1853  AST 24  ALT 23  ALKPHOS 77  BILITOT 1.1  PROT 7.8  ALBUMIN 4.0   No results for input(s): LIPASE, AMYLASE in the last 168 hours. No results for input(s): AMMONIA in the last 168 hours. Coagulation Profile: No results for input(s): INR, PROTIME in the last 168 hours. Cardiac Enzymes: No results for input(s): CKTOTAL, CKMB, CKMBINDEX, TROPONINI in the last 168 hours. BNP (last 3 results) No results for input(s): PROBNP in the last 8760 hours. HbA1C: No results for input(s): HGBA1C in the last 72 hours. CBG: No results for input(s): GLUCAP in the last 168 hours. Lipid Profile: No results for input(s): CHOL, HDL, LDLCALC, TRIG, CHOLHDL, LDLDIRECT in the last 72 hours. Thyroid Function Tests: No results for input(s): TSH, T4TOTAL, FREET4, T3FREE,  THYROIDAB in the last 72 hours. Anemia Panel: No results for input(s): VITAMINB12, FOLATE, FERRITIN, TIBC,  IRON, RETICCTPCT in the last 72 hours. Urine analysis:    Component Value Date/Time   COLORURINE YELLOW 05/28/2016 0316   APPEARANCEUR CLEAR 05/28/2016 0316   LABSPEC 1.007 05/28/2016 0316   PHURINE 6.0 05/28/2016 0316   GLUCOSEU NEGATIVE 05/28/2016 0316   HGBUR TRACE* 05/28/2016 0316   BILIRUBINUR NEGATIVE 05/28/2016 0316   KETONESUR NEGATIVE 05/28/2016 0316   PROTEINUR NEGATIVE 05/28/2016 0316   NITRITE NEGATIVE 05/28/2016 0316   LEUKOCYTESUR NEGATIVE 05/28/2016 0316     Debarah Mccumbers M.D. [facility] 07/14/2016, 12:35 PM  Pager: [phone] Between 7am to 7pm - call Pager - [phone]  After 7pm go to [email or web address] - password [email or web address]  Call night coverage person covering after 7pm

## 2016-07-14 NOTE — Progress Notes (Signed)
Pharmacy Antibiotic Note  Isaac Olson is a 25 y.o. male admitted on 07/11/2016 with cellulitis.  Pharmacy has been consulted for vancomycin and Zosyn dosing. Today is day 3 of Vanc/Zosyn. Vanc trough slightly below goal at 9 mcg/mL.  Plan: Increase vancomycin to 1250 mg IV Q8H.  Goal trough 10-15 mcg/mL. Continue Zosyn 3.375g IV Q8H Monitor renal fxn, c/s, clinical status, abx LOT, VT as indicated Transition to PO abx when feasible    Height: 5\' 9"  (175.3 cm) Weight: 239 lb 13.8 oz (108.8 kg) IBW/kg (Calculated) : 70.7  Temp (24hrs), Avg:98.8 F (37.1 C), Min:98.6 F (37 C), Max:99 F (37.2 C)   Recent Labs Lab 07/11/16 1853 07/11/16 1911 07/12/16 0609 07/13/16 0618 07/14/16 0737  WBC 19.1*  --  18.4*  --   --   CREATININE 1.00  --  0.98 1.09 0.96  LATICACIDVEN  --  0.85  --   --   --   VANCOTROUGH  --   --   --   --  9*    Estimated Creatinine Clearance: 142.9 mL/min (by C-G formula based on Cr of 0.96).    No Known Allergies  7/18 Zosyn >> 7/18 vancomycin >> -7/20 VT 9  7/17 BCx: NGTD  Thank you for allowing pharmacy to be a part of this patient's care.  Mackie Paienee Oiva Dibari, PharmD PGY1 Pharmacy Resident Pager: [phone] 07/14/2016 9:07 AM

## 2016-07-15 MED ORDER — DOXYCYCLINE HYCLATE 100 MG PO TABS
100.0000 mg | ORAL_TABLET | Freq: Two times a day (BID) | ORAL | Status: DC
  Administered 2016-07-15: 100 mg via ORAL
  Filled 2016-07-15: qty 1

## 2016-07-15 MED ORDER — DOXYCYCLINE HYCLATE 100 MG PO TABS: 100.0000 mg | ORAL_TABLET | Freq: Two times a day (BID) | ORAL | Status: AC

## 2016-07-15 MED ORDER — TRAMADOL HCL 50 MG PO TABS
50.0000 mg | ORAL_TABLET | Freq: Four times a day (QID) | ORAL | Status: AC | PRN
Start: 2016-07-15 — End: ?

## 2016-07-15 NOTE — Progress Notes (Signed)
Nsg Discharge Note  Admit Date:  07/11/2016 Discharge date: 07/15/2016   Isaac Olson to be D/C'd Home per MD order.  AVS completed.  Copy for chart, and copy for patient signed, and dated. Patient/caregiver able to verbalize understanding.  Discharge Medication:   Medication List    TAKE these medications        doxycycline 100 MG tablet  Commonly known as:  VIBRA-TABS  Take 1 tablet (100 mg total) by mouth 2 (two) times daily. X 10 days     traMADol 50 MG tablet  Commonly known as:  ULTRAM  Take 1 tablet (50 mg total) by mouth every 6 (six) hours as needed.        Discharge Assessment: Filed Vitals:   07/14/16 2226 07/15/16 0500  BP: 132/73 127/58  Pulse: 77 76  Temp: 98.3 F (36.8 C) 98.1 F (36.7 C)  Resp: 18 18   Skin clean, dry and intact without evidence of skin break down, no evidence of skin tears noted. IV catheter discontinued intact. Site without signs and symptoms of complications - no redness or edema noted at insertion site, patient denies c/o pain - only slight tenderness at site.  Dressing with slight pressure applied.  D/c Instructions-Education: Discharge instructions given to patient/family with verbalized understanding. D/c education completed with patient/family including follow up instructions, medication list, d/c activities limitations if indicated, with other d/c instructions as indicated by MD - patient able to verbalize understanding, all questions fully answered. Patient instructed to return to ED, call 911, or call MD for any changes in condition.  Patient escorted via himself, refused Wheelchair.  Kern ReapBrumagin, Jahanna Raether L, RN 07/15/2016 11:05 AM

## 2016-07-15 NOTE — Discharge Summary (Signed)
Physician Discharge Summary   Patient ID: Isaac Olson MRN: [id number] DOB/AGE: 1990-12-27 25 y.o.  Admit date: 07/11/2016 Discharge date: 07/15/2016  Primary Care Physician:  No PCP Per Patient  Discharge Diagnoses:     . CellulitisOf the left upper extremity  Consults: Orthopedics  Recommendations for Outpatient Follow-up:  1. Please repeat CBC/BMET at next visit   DIET: Regular diet    Allergies:  No Known Allergies   DISCHARGE MEDICATIONS: Discharge Medication List as of 07/15/2016 10:49 AM    START taking these medications   Details  doxycycline (VIBRA-TABS) 100 MG tablet Take 1 tablet (100 mg total) by mouth 2 (two) times daily. X 10 days, Starting 07/15/2016, Until Discontinued, Print      CONTINUE these medications which have CHANGED   Details  traMADol (ULTRAM) 50 MG tablet Take 1 tablet (50 mg total) by mouth every 6 (six) hours as needed., Starting 07/15/2016, Until Discontinued, Print         Brief H and P: For complete details please refer to admission H and P, but in briefPatient is a 25 year old male with no significant medical history presented to ED withleft upper extremity swelling and pain. Patient also has been having some fever and chills. Patient's symptoms started 3 days ago with pain and swelling involving the elbow area which has progressed both distally and proximally. At the time of admission, patient was able to flex his joints without difficulty. Patient has significant swelling of the entire arm most prominent around the middle. Denies any insect bites or trauma. X-rays reveal edema, patient was admitted for further workup.  Hospital Course:   Left Cellulitis of left upper extremity-Improving - MRI showed diffuse circumferential soft tissue edema consistent with cellulitis, ill-defined area of focal complex fluid overlying the olecranon process concerning for small hematoma or abscess 2x1x1.7cm - No DVTOn Doppler ultrasound - Patient was  placed on IV vancomycin and Zosyn and elevation of the arm. He was transitioned to oral doxycycline, to continue for 10 more days.  - orthopedics was consulted on 7/18, patient seen by Dr. Sherlean FootLucey who did not feel surgery is needed  Day of Discharge BP 127/58 mmHg  Pulse 76  Temp(Src) 98.1 F (36.7 C) (Oral)  Resp 18  Ht 5\' 9"  (1.753 m)  Wt 108.8 kg (239 lb 13.8 oz)  BMI 35.41 kg/m2  SpO2 98%  Physical Exam: General: Alert and awake oriented x3 not in any acute distress. HEENT: anicteric sclera, pupils reactive to light and accommodation CVS: S1-S2 clear no murmur rubs or gallops Chest: clear to auscultation bilaterally, no wheezing rales or rhonchi Abdomen: soft nontender, nondistended, normal bowel sounds Extremities: no cyanosis, clubbing or edema noted bilaterally, left upper extremity, elbow cellulitis improved, able to flex and extend the arm. Neuro: Cranial nerves II-XII intact, no focal neurological deficits   The results of significant diagnostics from this hospitalization (including imaging, microbiology, ancillary and laboratory) are listed below for reference.    LAB RESULTS: Basic Metabolic Panel:  Recent Labs Lab 07/13/16 0618 07/14/16 0737  NA 134* 136  K 3.8 3.7  CL 100* 103  CO2 27 27  GLUCOSE 95 98  BUN 7 6  CREATININE 1.09 0.96  CALCIUM 9.2 9.1   Liver Function Tests:  Recent Labs Lab 07/11/16 1853  AST 24  ALT 23  ALKPHOS 77  BILITOT 1.1  PROT 7.8  ALBUMIN 4.0   No results for input(s): LIPASE, AMYLASE in the last 168 hours. No results for input(s):  AMMONIA in the last 168 hours. CBC:  Recent Labs Lab 07/11/16 1853 07/12/16 0609  WBC 19.1* 18.4*  NEUTROABS 15.7*  --   HGB 13.8 13.1  HCT 42.7 40.1  MCV 83.2 82.9  PLT 175 176   Cardiac Enzymes: No results for input(s): CKTOTAL, CKMB, CKMBINDEX, TROPONINI in the last 168 hours. BNP: Invalid input(s): POCBNP CBG: No results for input(s): GLUCAP in the last 168  hours.  Significant Diagnostic Studies:  Dg Elbow Complete Left  07/11/2016  CLINICAL DATA:  Left elbow pain and swelling. Left elbow appears red and swollen. Patient reports chills at home. Elbow and forearm are approximately twice the size of the contralateral arm. EXAM: LEFT ELBOW - COMPLETE 3+ VIEW COMPARISON:  None FINDINGS: Four views of the left elbow are provided. Osseous alignment is normal. Bone mineralization is normal. No fracture line or displaced fracture fragment seen. No focal cortical irregularity or osseous lesion. No degenerative change seen. No appreciable joint effusion. Prominent soft tissue swelling/edema noted, as indicated in the clinical data. IMPRESSION: Soft tissue swelling/edema. No osseous abnormality. No appreciable joint effusion. Electronically Signed   By: Bary Richard M.D.   On: 07/11/2016 19:38   Mr Elbow Left Wo Contrast  07/12/2016  CLINICAL DATA:  Left upper extremity swelling and pain. EXAM: MRI OF THE LEFT ELBOW WITHOUT CONTRAST TECHNIQUE: Multiplanar, multisequence MR imaging of the elbow was performed. No intravenous contrast was administered. COMPARISON:  None. FINDINGS: TENDONS Common forearm flexor origin: Intact. Common forearm extensor origin: Intact. Biceps: Intact. Triceps: Intact. LIGAMENTS Medial stabilizers: Intact. Lateral stabilizers:  Intact. Cartilage: No chondral defect. Joint: No joint effusion. Cubital tunnel: Normal. Bones: No marrow signal abnormality.  No fracture or dislocation. Soft tissue: Diffuse circumferential soft tissue edema throughout the subcutaneous fat of the distal humerus and proximal forearm most severe posteriorly concerning for cellulitis. Ill-defined area of focal complex fluid overlying the olecranon process concerning for small hematoma or abscess measuring approximately 2 x 1 x 1.7 cm. Characterization is limited secondary to lack of intravenous contrast. Mild soft tissue edema in the proximal extensor digitorum muscle  likely reactive secondary to adjacent inflammation. IMPRESSION: 1. Diffuse circumferential soft tissue edema throughout the subcutaneous fat of the distal humerus and proximal forearm most severe posteriorly concerning for cellulitis. Ill-defined area of focal complex fluid overlying the olecranon process concerning for small hematoma or abscess measuring approximately 2 x 1 x 1.7 cm. Sonographic guided aspiration may be helpful. Electronically Signed   By: Elige Ko   On: 07/12/2016 11:13    2D ECHO:   Disposition and Follow-up: Discharge Instructions    Diet general    Complete by:  As directed      Increase activity slowly    Complete by:  As directed             DISPOSITION: home    DISCHARGE FOLLOW-UP Follow-up Information    Follow up with [facility].   Why:  To get Medications after discharge   Contact information:   [address] [place] [address] [phone]      Follow up with [facility]. Go on 07/15/2016.   Specialty:  Internal Medicine   Why:  at 10:45. Please bring your photo ID and arrive 15 minutes early.   Contact information:   [address] [place] [address] [phone]       Time spent on Discharge:   Signed:  Aryah Doering M.D. [facility] 07/15/2016, 11:20 AM Pager: [phone]

## 2016-07-17 LAB — CULTURE, BLOOD (ROUTINE X 2)
Culture: NO GROWTH
Culture: NO GROWTH

## 2016-08-15 ENCOUNTER — Encounter: Payer: Self-pay | Admitting: Family Medicine

## 2016-08-15 ENCOUNTER — Ambulatory Visit (INDEPENDENT_AMBULATORY_CARE_PROVIDER_SITE_OTHER): Payer: Self-pay | Admitting: Family Medicine

## 2016-08-15 VITALS — BP 141/70 | HR 81 | Temp 98.6°F | Resp 16 | Ht 69.0 in | Wt 247.0 lb

## 2016-08-15 DIAGNOSIS — Z0000 Encounter for general adult medical examination without abnormal findings: Secondary | ICD-10-CM | POA: Insufficient documentation

## 2016-08-15 DIAGNOSIS — Z23 Encounter for immunization: Secondary | ICD-10-CM

## 2016-08-15 LAB — CBC WITH DIFFERENTIAL/PLATELET
BASOS ABS: 0 {cells}/uL (ref 0–200)
Basophils Relative: 0 %
EOS ABS: 195 {cells}/uL (ref 15–500)
Eosinophils Relative: 3 %
HEMATOCRIT: 42.9 % (ref 38.5–50.0)
HEMOGLOBIN: 13.8 g/dL (ref 13.2–17.1)
LYMPHS ABS: 1950 {cells}/uL (ref 850–3900)
LYMPHS PCT: 30 %
MCH: 26.5 pg — AB (ref 27.0–33.0)
MCHC: 32.2 g/dL (ref 32.0–36.0)
MCV: 82.5 fL (ref 80.0–100.0)
MONO ABS: 455 {cells}/uL (ref 200–950)
MPV: 11.4 fL (ref 7.5–12.5)
Monocytes Relative: 7 %
NEUTROS PCT: 60 %
Neutro Abs: 3900 cells/uL (ref 1500–7800)
Platelets: 167 10*3/uL (ref 140–400)
RBC: 5.2 MIL/uL (ref 4.20–5.80)
RDW: 14.6 % (ref 11.0–15.0)
WBC: 6.5 10*3/uL (ref 3.8–10.8)

## 2016-08-15 NOTE — Progress Notes (Signed)
Isaac Olson, is a 25 y.o. male  ZOX:[id number]  [id number]:[id number]  DOB - August 03, 1991  CC:  Chief Complaint  Patient presents with  . Establish Care    spider bite on left elbow        HPI: Isaac Olson is a 25 y.o. male here to establish care. He was in hospital a few days last month and was treated with antibiotics for cellulits of his left elbow, following a spider bite. He was referred her to establish primary care and for follow-up. He reports his arm is back to normal except for an occ pulling feeling when he bends his elbow. 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 Has a history of ADHD and stomach ulcer. He is on no medications  Health Maintenance: He is to receive Tdap and influenze vaccines today, as well as A1C for diabetes screening.He reports smoking about a pack of cigarettes a week. He denies alcohol and drug use.He tries to avoid starches in his diet. He does not exercise regularly other than physical activity at work.  No Known Allergies Past Medical History:  Diagnosis Date  . ADHD (attention deficit hyperactivity disorder)   . Bulge of cervical disc without myelopathy   . Stomach ulcer    Current Outpatient Prescriptions on File Prior to Visit  Medication Sig Dispense Refill  . doxycycline (VIBRA-TABS) 100 MG tablet Take 1 tablet (100 mg total) by mouth 2 (two) times daily. X 10 days (Patient not taking: Reported on 08/15/2016) 20 tablet 0  . traMADol (ULTRAM) 50 MG tablet Take 1 tablet (50 mg total) by mouth every 6 (six) hours as needed. (Patient not taking: Reported on 08/15/2016) 15 tablet 0   No current facility-administered medications on file prior to visit.    Family History  Problem Relation Age of Onset  . Diabetes Maternal Grandmother   . Depression Mother   . Diabetes Other   . Heart failure Other    Social History   Social History  . Marital status: Single    Spouse name: N/A  . Number of children: N/A  . Years of education:  N/A   Occupational History  . Not on file.   Social History Main Topics  . Smoking status: Current Every Day Smoker    Packs/day: 0.50    Types: Cigarettes  . Smokeless tobacco: Never Used  . Alcohol use No  . Drug use: No  . Sexual activity: Not on file   Other Topics Concern  . Not on file   Social History Narrative  . No narrative on file    Review of Systems: Constitutional: Negative for fever, chills, appetite change, weight loss,  Fatigue. Skin: Negative for rashes or lesions of concern. HENT: Negative for ear pain, ear discharge.nose bleeds Eyes: Negative for pain, discharge, redness, itching and visual disturbance. Neck: Negative for pain, stiffness Respiratory: Negative for cough, shortness of breath,   Cardiovascular: Negative for chest pain, palpitations and leg swelling. Gastrointestinal: Negative for abdominal pain, nausea, vomiting, diarrhea, constipations Genitourinary: Negative for dysuria, urgency, frequency, hematuria,  Musculoskeletal: Negative for back pain, joint pain, joint  swelling, and gait problem.Negative for weakness. Some upper back and neck pain Neurological: Negative for dizziness, tremors, seizures, syncope,   light-headedness, numbness and headaches.  Hematological: Negative for easy bruising or bleeding Psychiatric/Behavioral: Negative for depression, anxiety, decreased concentration, confusion   Objective:   Vitals:   08/15/16 1019  BP: (!) 141/70  Pulse: 81  Resp: 16  Temp: 98.6 F (37 C)    Physical Exam: Constitutional: Patient appears well-developed and well-nourished. No distress. HENT: Normocephalic, atraumatic, External right and left ear normal. Oropharynx is clear and moist.  Eyes: Conjunctivae and EOM are normal. PERRLA, no scleral icterus. Neck: Normal ROM. Neck supple. No lymphadenopathy, No thyromegaly. CVS: RRR, S1/S2 +, no murmurs, no gallops, no rubs Pulmonary: Effort and breath sounds normal, no stridor,  rhonchi, wheezes, rales.  Abdominal: Soft. Normoactive BS,, no distension, tenderness, rebound or guarding.  Musculoskeletal: Normal range of motion. No edema and no tenderness.  Neuro: Alert.Normal muscle tone coordination. Non-focal Skin: Skin is warm and dry. No rash noted. Not diaphoretic. No erythema. No pallor. Psychiatric: Normal mood and affect. Behavior, judgment, thought content normal.  Lab Results  Component Value Date   WBC 18.4 (H) 07/12/2016   HGB 13.1 07/12/2016   HCT 40.1 07/12/2016   MCV 82.9 07/12/2016   PLT 176 07/12/2016   Lab Results  Component Value Date   CREATININE 0.96 07/14/2016   BUN 6 07/14/2016   NA 136 07/14/2016   K 3.7 07/14/2016   CL 103 07/14/2016   CO2 27 07/14/2016    No results found for: HGBA1C Lipid Panel  No results found for: CHOL, TRIG, HDL, CHOLHDL, VLDL, LDLCALC     Assessment and plan:   1. Healthcare maintenance  - Flu Vaccine QUAD 36+ mos IM - Hemoglobin A1c - CBC with Differential - Tdap vaccine greater than or equal to 7yo IM   Return in about 1 year (around 08/15/2017).  The patient was given clear instructions to go to ER or return to medical center if symptoms don't improve, worsen or new problems develop. The patient verbalized understanding.    Henrietta HooverLinda C Jolane Bankhead FNP  08/15/2016, 11:08 AM

## 2016-08-15 NOTE — Patient Instructions (Signed)
Try to eat a healthy diet with lots of vegetables, moderate fruits and lean meats. Try to exercise 3 times a week other than at work.

## 2016-08-16 LAB — HEMOGLOBIN A1C
Hgb A1c MFr Bld: 5.2 % (ref <5.7)
Mean Plasma Glucose: 103 mg/dL

## 2020-10-05 ENCOUNTER — Emergency Department (HOSPITAL_COMMUNITY)
Admission: EM | Admit: 2020-10-05 | Discharge: 2020-10-05 | Disposition: A | Discharge status: Home / Self Care | Payer: Self-pay | Attending: Emergency Medicine | Admitting: Emergency Medicine

## 2020-10-05 ENCOUNTER — Emergency Department (HOSPITAL_COMMUNITY): Payer: Self-pay

## 2020-10-05 DIAGNOSIS — R0602 Shortness of breath: Secondary | ICD-10-CM | POA: Insufficient documentation

## 2020-10-05 DIAGNOSIS — F17210 Nicotine dependence, cigarettes, uncomplicated: Secondary | ICD-10-CM | POA: Insufficient documentation

## 2020-10-05 DIAGNOSIS — R079 Chest pain, unspecified: Secondary | ICD-10-CM | POA: Insufficient documentation

## 2020-10-05 LAB — CBC
HCT: 43.5 % (ref 39.0–52.0)
Hemoglobin: 13.5 g/dL (ref 13.0–17.0)
MCH: 26.5 pg (ref 26.0–34.0)
MCHC: 31 g/dL (ref 30.0–36.0)
MCV: 85.5 fL (ref 80.0–100.0)
Platelets: 193 10*3/uL (ref 150–400)
RBC: 5.09 MIL/uL (ref 4.22–5.81)
RDW: 13.4 % (ref 11.5–15.5)
WBC: 10.8 10*3/uL — ABNORMAL HIGH (ref 4.0–10.5)
nRBC: 0 % (ref 0.0–0.2)

## 2020-10-05 LAB — BASIC METABOLIC PANEL
Anion gap: 7 (ref 5–15)
BUN: 10 mg/dL (ref 6–20)
CO2: 28 mmol/L (ref 22–32)
Calcium: 9.3 mg/dL (ref 8.9–10.3)
Chloride: 103 mmol/L (ref 98–111)
Creatinine, Ser: 1.04 mg/dL (ref 0.61–1.24)
GFR, Estimated: >60 mL/min (ref >60)
Glucose, Bld: 103 mg/dL — ABNORMAL HIGH (ref 70–99)
Potassium: 4 mmol/L (ref 3.5–5.1)
Sodium: 138 mmol/L (ref 135–145)

## 2020-10-05 LAB — TROPONIN I (HIGH SENSITIVITY)
Troponin I (High Sensitivity): 4 ng/L (ref <18)
Troponin I (High Sensitivity): 4 ng/L (ref <18)

## 2020-10-05 MED ORDER — ALUM & MAG HYDROXIDE-SIMETH 200-200-20 MG/5ML PO SUSP
30.0000 mL | Freq: Once | ORAL | Status: DC
  Filled 2020-10-05: qty 30

## 2020-10-05 MED ORDER — LIDOCAINE VISCOUS HCL 2 % MT SOLN
15.0000 mL | Freq: Once | OROMUCOSAL | Status: DC
  Filled 2020-10-05: qty 15

## 2020-10-05 NOTE — ED Notes (Signed)
Called No answer.

## 2020-10-05 NOTE — ED Triage Notes (Signed)
Pt arrives to ED from work via [facility] with c/o of left sided chest pain worse with moving and deep breath.

## 2020-10-05 NOTE — ED Provider Notes (Signed)
[facility] EMERGENCY DEPARTMENT Provider Note   CSN: [id number] Arrival date & time: 10/05/20  1224     History Chief Complaint  Patient presents with   Chest Pain    Isaac Olson is a 29 y.o. male ADHD, stomach ulcers who presents with chest pain.  Patient states his chest pain started 2 days ago while talking to his boss at work.  Describes it as a sharp central and left chest pain.  It has been constant over the last several days, although it has waxed and waned.  States it was also associated with shortness of breath.  Denies fevers, cough, nausea, vomiting.  Pain radiates into his legs sometimes.  His pain is worse when he takes a deep breath or when he moves at all.  States he took aspirin at home which did not help. Does have history of tobacco use..   Chest Pain Pain location:  L chest Pain quality: sharp and stabbing   Pain severity:  Moderate Onset quality:  Sudden Chronicity:  New Relieved by:  Nothing Worsened by:  Movement and deep breathing Ineffective treatments:  Aspirin Associated symptoms: shortness of breath   Associated symptoms: no abdominal pain, no back pain, no cough, no fever, no palpitations and no vomiting        Past Medical History:  Diagnosis Date   ADHD (attention deficit hyperactivity disorder)    Bulge of cervical disc without myelopathy    Stomach ulcer     Patient Active Problem List   Diagnosis Date Noted   Healthcare maintenance 08/15/2016   Cellulitis of left upper extremity 07/12/2016   Cellulitis 07/11/2016    No past surgical history on file.     Family History  Problem Relation Age of Onset   Diabetes Maternal Grandmother    Depression Mother    Diabetes Other    Heart failure Other     Social History   Tobacco Use   Smoking status: Current Every Day Smoker    Packs/day: 0.50    Types: Cigarettes   Smokeless tobacco: Never Used  Substance Use Topics   Alcohol use: No   Drug  use: No    Home Medications Prior to Admission medications   Medication Sig Start Date End Date Taking? Authorizing Provider  doxycycline (VIBRA-TABS) 100 MG tablet Take 1 tablet (100 mg total) by mouth 2 (two) times daily. X 10 days Patient not taking: Reported on 08/15/2016 07/15/16   Rai, Delene Ruffini, MD  traMADol (ULTRAM) 50 MG tablet Take 1 tablet (50 mg total) by mouth every 6 (six) hours as needed. Patient not taking: Reported on 08/15/2016 07/15/16   Cathren Harsh, MD    Allergies    Patient has no known allergies.  Review of Systems   Review of Systems  Constitutional: Negative for chills and fever.  HENT: Negative for ear pain and sore throat.   Eyes: Negative for pain and visual disturbance.  Respiratory: Positive for shortness of breath. Negative for cough.   Cardiovascular: Positive for chest pain. Negative for palpitations.  Gastrointestinal: Negative for abdominal pain and vomiting.  Genitourinary: Negative for dysuria and hematuria.  Musculoskeletal: Negative for arthralgias and back pain.  Skin: Negative for color change and rash.  Neurological: Negative for seizures and syncope.  All other systems reviewed and are negative.   Physical Exam Updated Vital Signs BP (!) 158/91 (BP Location: Left Arm)    Pulse 65    Temp 98.4 F (36.9  C) (Oral)    Resp 16    SpO2 100%   Physical Exam Vitals and nursing note reviewed.  Constitutional:      Appearance: He is well-developed.  HENT:     Head: Normocephalic and atraumatic.  Eyes:     Conjunctiva/sclera: Conjunctivae normal.  Cardiovascular:     Rate and Rhythm: Normal rate and regular rhythm.     Heart sounds: No murmur heard.   Pulmonary:     Effort: Pulmonary effort is normal. No respiratory distress.     Breath sounds: Normal breath sounds.  Chest:     Chest wall: Tenderness present.  Abdominal:     Palpations: Abdomen is soft.     Tenderness: There is no abdominal tenderness.  Musculoskeletal:      Cervical back: Neck supple.     Right lower leg: No tenderness. No edema.     Left lower leg: No tenderness. No edema.  Skin:    General: Skin is warm and dry.  Neurological:     General: No focal deficit present.     Mental Status: He is alert and oriented to person, place, and time.     ED Results / Procedures / Treatments   Labs (all labs ordered are listed, but only abnormal results are displayed) Labs Reviewed  BASIC METABOLIC PANEL - Abnormal; Notable for the following components:      Result Value   Glucose, Bld 103 (*)    All other components within normal limits  CBC - Abnormal; Notable for the following components:   WBC 10.8 (*)    All other components within normal limits  TROPONIN I (HIGH SENSITIVITY)  TROPONIN I (HIGH SENSITIVITY)    EKG None  Radiology DG Chest 2 View  Result Date: 10/05/2020 CLINICAL DATA:  Shortness of breath. EXAM: CHEST - 2 VIEW COMPARISON:  November 25, 2011. FINDINGS: The heart size and mediastinal contours are within normal limits. Both lungs are clear. No pneumothorax or pleural effusion is noted. The visualized skeletal structures are unremarkable. IMPRESSION: No active cardiopulmonary disease. Electronically Signed   By: Lupita Raider M.D.   On: 10/05/2020 12:55    Procedures Procedures (including critical care time)  Medications Ordered in ED Medications  alum & mag hydroxide-simeth (MAALOX/MYLANTA) 200-200-20 MG/5ML suspension 30 mL (30 mLs Oral Not Given 10/05/20 1736)    And  lidocaine (XYLOCAINE) 2 % viscous mouth solution 15 mL (15 mLs Oral Not Given 10/05/20 1737)    ED Course  I have reviewed the triage vital signs and the nursing notes.  Pertinent labs & imaging results that were available during my care of the patient were reviewed by me and considered in my medical decision making (see chart for details).    MDM Rules/Calculators/A&P                         Not consistent with aortic dissection, PE. PERC  0.  CBC, CMP unremarkable.  Troponin was 4, repeat 4. Chest x-ray without pneumonia, pneumothorax, or other disease.  EKG normal sinus rhythm, no ST elevation or depressions. HEAR score 1.  Not consistent with ACS at this time.  Patient offered GI cocktail, but refused.  There is discharge at this time.  Feel he is stable for discharge and return precautions given.  This patient was seen with Dr. Lynelle Doctor. Final Clinical Impression(s) / ED Diagnoses Final diagnoses:  Chest pain, unspecified type    Rx / DC  Orders ED Discharge Orders    None       Allayne Butcher, MD 10/05/20 2221    Linwood Dibbles, MD 10/08/20 [phone]

## 2021-01-13 ENCOUNTER — Emergency Department (HOSPITAL_COMMUNITY)
Admission: EM | Admit: 2021-01-13 | Discharge: 2021-01-13 | Disposition: A | Discharge status: Home / Self Care | Payer: Self-pay | Attending: Emergency Medicine | Admitting: Emergency Medicine

## 2021-01-13 ENCOUNTER — Other Ambulatory Visit: Payer: Self-pay

## 2021-01-13 ENCOUNTER — Encounter (HOSPITAL_BASED_OUTPATIENT_CLINIC_OR_DEPARTMENT_OTHER): Payer: Self-pay

## 2021-01-13 ENCOUNTER — Emergency Department (HOSPITAL_BASED_OUTPATIENT_CLINIC_OR_DEPARTMENT_OTHER)
Admission: EM | Admit: 2021-01-13 | Discharge: 2021-01-13 | Disposition: A | Discharge status: Home / Self Care | Payer: Self-pay | Attending: Emergency Medicine | Admitting: Emergency Medicine

## 2021-01-13 ENCOUNTER — Emergency Department (HOSPITAL_BASED_OUTPATIENT_CLINIC_OR_DEPARTMENT_OTHER): Payer: Self-pay

## 2021-01-13 ENCOUNTER — Encounter (HOSPITAL_COMMUNITY): Payer: Self-pay | Admitting: Emergency Medicine

## 2021-01-13 DIAGNOSIS — F17210 Nicotine dependence, cigarettes, uncomplicated: Secondary | ICD-10-CM | POA: Insufficient documentation

## 2021-01-13 DIAGNOSIS — N50812 Left testicular pain: Secondary | ICD-10-CM | POA: Insufficient documentation

## 2021-01-13 DIAGNOSIS — N50811 Right testicular pain: Secondary | ICD-10-CM | POA: Insufficient documentation

## 2021-01-13 DIAGNOSIS — M545 Low back pain, unspecified: Secondary | ICD-10-CM | POA: Insufficient documentation

## 2021-01-13 DIAGNOSIS — Z5321 Procedure and treatment not carried out due to patient leaving prior to being seen by health care provider: Secondary | ICD-10-CM | POA: Insufficient documentation

## 2021-01-13 DIAGNOSIS — N50819 Testicular pain, unspecified: Secondary | ICD-10-CM | POA: Insufficient documentation

## 2021-01-13 LAB — URINALYSIS, ROUTINE W REFLEX MICROSCOPIC
Bacteria, UA: NONE SEEN
Bilirubin Urine: NEGATIVE
Glucose, UA: NEGATIVE mg/dL
Ketones, ur: NEGATIVE mg/dL
Leukocytes,Ua: NEGATIVE
Nitrite: NEGATIVE
Protein, ur: NEGATIVE mg/dL
Specific Gravity, Urine: 1.026 (ref 1.005–1.030)
pH: 5 (ref 5.0–8.0)

## 2021-01-13 NOTE — ED Triage Notes (Addendum)
Pt c/o lower back pain, bilat testicle pain x 4 days-denies injury-per pt [facility] ED today and was seen at [facility]-steady gait

## 2021-01-13 NOTE — Discharge Instructions (Addendum)
Ultrasound showed small hydroceles bilaterally overall this is possibly the cause of your pain, follow-up with urology.

## 2021-01-13 NOTE — ED Notes (Signed)
Pt did not answer x 3 

## 2021-01-13 NOTE — ED Triage Notes (Signed)
Patient endorses bilateral lower back pain and testicle pain x4 days. Denies urinary symptoms, pain with urination, blood in urine. Denies N/V/D. Denies trauma or heavy lifting. Denies swelling or redness in testicles.

## 2021-01-13 NOTE — ED Provider Notes (Signed)
[facility] EMERGENCY DEPARTMENT Provider Note   CSN: [id number] Arrival date & time: 01/13/21  2000     History Chief Complaint  Patient presents with  . Back Pain    Isaac Olson is a 30 y.o. male.  The history is provided by the patient.  Male GU Problem Presenting symptoms: scrotal pain   Presenting symptoms: no dysuria, no penile discharge and no penile pain   Relieved by:  Nothing Worsened by:  Nothing Associated symptoms: groin pain   Associated symptoms: no abdominal pain, no diarrhea, no fever, no flank pain, no genital itching, no genital lesions, no genital rash, no hematuria, no nausea, no penile swelling, no scrotal swelling, no urinary frequency, no urinary hesitation, no urinary incontinence, no urinary retention and no vomiting   Risk factors: does not have multiple sexual partners and no STI exposure        Past Medical History:  Diagnosis Date  . ADHD (attention deficit hyperactivity disorder)   . Bulge of cervical disc without myelopathy   . Stomach ulcer     Patient Active Problem List   Diagnosis Date Noted  . Healthcare maintenance 08/15/2016  . Cellulitis of left upper extremity 07/12/2016  . Cellulitis 07/11/2016    History reviewed. No pertinent surgical history.     Family History  Problem Relation Age of Onset  . Diabetes Maternal Grandmother   . Depression Mother   . Diabetes Other   . Heart failure Other     Social History   Tobacco Use  . Smoking status: Current Every Day Smoker    Packs/day: 0.50    Types: Cigarettes  . Smokeless tobacco: Never Used  Vaping Use  . Vaping Use: Never used  Substance Use Topics  . Alcohol use: No  . Drug use: No    Home Medications Prior to Admission medications   Medication Sig Start Date End Date Taking? Authorizing Provider  doxycycline (VIBRA-TABS) 100 MG tablet Take 1 tablet (100 mg total) by mouth 2 (two) times daily. X 10 days Patient not taking: Reported on  08/15/2016 07/15/16   Rai, Delene Ruffini, MD  traMADol (ULTRAM) 50 MG tablet Take 1 tablet (50 mg total) by mouth every 6 (six) hours as needed. Patient not taking: Reported on 08/15/2016 07/15/16   Cathren Harsh, MD    Allergies    Patient has no known allergies.  Review of Systems   Review of Systems  Constitutional: Negative for chills and fever.  HENT: Negative for ear pain and sore throat.   Eyes: Negative for pain and visual disturbance.  Respiratory: Negative for cough and shortness of breath.   Cardiovascular: Negative for chest pain and palpitations.  Gastrointestinal: Negative for abdominal pain, diarrhea, nausea and vomiting.  Genitourinary: Positive for testicular pain. Negative for bladder incontinence, decreased urine volume, difficulty urinating, dysuria, enuresis, flank pain, frequency, genital sores, hematuria, hesitancy, penile discharge, penile pain, penile swelling, scrotal swelling and urgency.  Musculoskeletal: Positive for back pain. Negative for arthralgias.  Skin: Negative for color change and rash.  Neurological: Negative for seizures and syncope.  All other systems reviewed and are negative.   Physical Exam Updated Vital Signs  ED Triage Vitals  Enc Vitals Group     BP 01/13/21 2008 (!) 161/100     Pulse Rate 01/13/21 2008 84     Resp 01/13/21 2008 16     Temp 01/13/21 2008 98.2 F (36.8 C)     Temp Source 01/13/21 2008  Oral     SpO2 01/13/21 2008 100 %     Weight 01/13/21 2009 260 lb (117.9 kg)     Height 01/13/21 2009 5\' 9"  (1.753 m)     Head Circumference --      Peak Flow --      Pain Score 01/13/21 2007 10     Pain Loc --      Pain Edu? --      Excl. in GC? --     Physical Exam Vitals and nursing note reviewed.  Constitutional:      Appearance: He is well-developed and well-nourished.  HENT:     Head: Normocephalic and atraumatic.  Eyes:     Extraocular Movements: Extraocular movements intact.     Conjunctiva/sclera: Conjunctivae normal.      Pupils: Pupils are equal, round, and reactive to light.  Cardiovascular:     Heart sounds: No murmur heard.   Pulmonary:     Effort: Pulmonary effort is normal. No respiratory distress.     Breath sounds: Normal breath sounds.  Abdominal:     Palpations: Abdomen is soft.     Tenderness: There is no abdominal tenderness.  Genitourinary:    Penis: Normal.      Testes:        Right: Tenderness present. Mass, swelling or testicular hydrocele not present. Cremasteric reflex is present.         Left: Tenderness present. Mass or swelling not present. Cremasteric reflex is present.      Epididymis:     Right: Normal. Not enlarged. No tenderness.     Left: Normal. Not enlarged. No tenderness.  Musculoskeletal:        General: No tenderness or edema.  Skin:    General: Skin is warm and dry.  Neurological:     General: No focal deficit present.     Mental Status: He is alert.     Motor: No weakness.  Psychiatric:        Mood and Affect: Mood and affect normal.     ED Results / Procedures / Treatments   Labs (all labs ordered are listed, but only abnormal results are displayed) Labs Reviewed  GC/CHLAMYDIA PROBE AMP ([place]) NOT AT [facility]    EKG None  Radiology [facility] SCROTUM W/DOPPLER  Result Date: 01/13/2021 CLINICAL DATA:  Back pain radiating to the groin and scrotum x5 days. EXAM: SCROTAL ULTRASOUND DOPPLER ULTRASOUND OF THE TESTICLES TECHNIQUE: Complete ultrasound examination of the testicles, epididymis, and other scrotal structures was performed. Color and spectral Doppler ultrasound were also utilized to evaluate blood flow to the testicles. COMPARISON:  None. FINDINGS: Right testicle Measurements: 3.9 cm x 2.5 cm x 2.6 cm. A 2.3 mm x 1.7 mm calcification is seen within the right testicle. Left testicle Measurements: 3.9 cm x 2.3 cm x 2.4 cm. No mass or microlithiasis visualized. Right epididymis: An 8 mm x 5 mm x 5 mm right epididymal head cyst is seen. Left epididymis:   The left epididymis is not clearly visualized. Hydrocele:  Small bilateral hydroceles are seen. Varicocele:  None visualized. Pulsed Doppler interrogation of both testes demonstrates normal low resistance arterial and venous waveforms bilaterally. IMPRESSION: 1. Small bilateral hydroceles. 2. Subcentimeter right epididymal head cyst. 3. Normal bilateral testicular flow, without evidence of testicular torsion. Electronically Signed   By: 01/15/2021 M.D.   On: 01/13/2021 21:30    Procedures Procedures (including critical care time)  Medications Ordered in ED Medications - No data  to display  ED Course  I have reviewed the triage vital signs and the nursing notes.  Pertinent labs & imaging results that were available during my care of the patient were reviewed by me and considered in my medical decision making (see chart for details).    MDM Rules/Calculators/A&P                          Isaac Olson is a 30 year old male with no significant medical history presents the ED with testicular pain.  Unremarkable vitals.  No fever.  Denies any concern for STDs.  No discharge.  Tenderness to bilateral testicles but no obvious swollen or inflammation on exam.  Ultrasound showed no evidence of torsion.  Did have a small right epididymal head cyst as well as 2 small bilateral hydroceles.  Urinalysis done at urgent care was negative for infection.  Will check for gonorrhea and chlamydia.  Does not have any abdominal tenderness.  Overall appears comfortable.  Low suspicion for kidney stone if he has 1 its very small.  Overall suspect symptoms will improve on their own.  Recommend Tylenol, Motrin.  Given referral to urology to follow-up if pain continues.  Discharged in ED in good condition.  Understands return precautions.  This chart was dictated using voice recognition software.  Despite best efforts to proofread,  errors can occur which can change the documentation meaning.    Final Clinical  Impression(s) / ED Diagnoses Final diagnoses:  Pain in both testicles    Rx / DC Orders ED Discharge Orders    None       Virgina Norfolk, DO 01/13/21 2156

## 2021-05-27 IMAGING — US US SCROTUM W/ DOPPLER COMPLETE
1 series · 14 of 25 positions shown · non-contrast
Comparison: None.

CLINICAL DATA: Back pain radiating to the groin and scrotum x5
days.

EXAM:
SCROTAL ULTRASOUND
DOPPLER ULTRASOUND OF THE TESTICLES
TECHNIQUE: Complete ultrasound examination of the testicles, epididymis, and
other scrotal structures was performed. Color and spectral Doppler
ultrasound were also utilized to evaluate blood flow to the
testicles.

[Series 1: us scrotum w/ doppler complete · 14 of 66 slices shown]
[im 1/66]
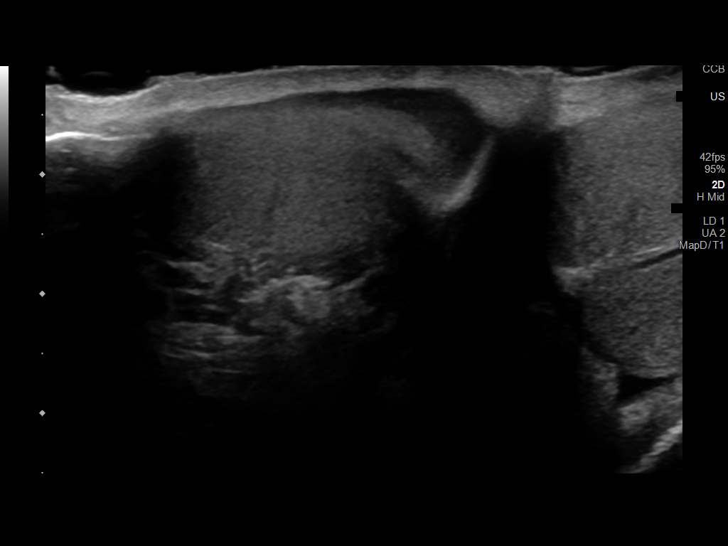
[im 6/66]
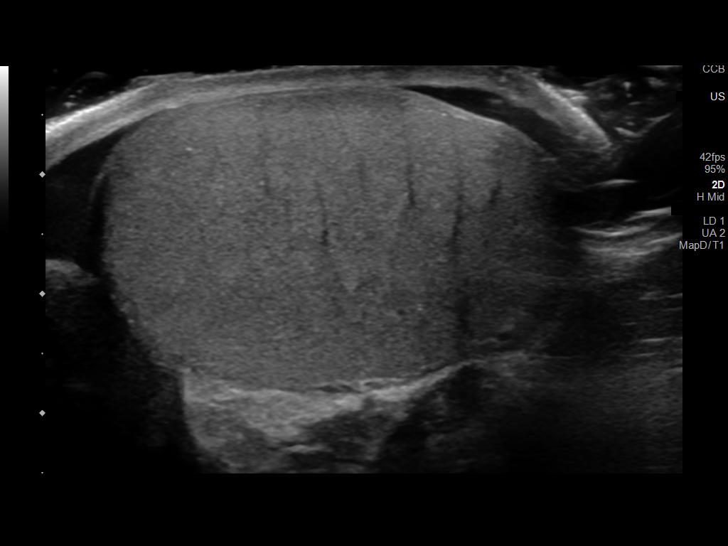
[im 11/66]
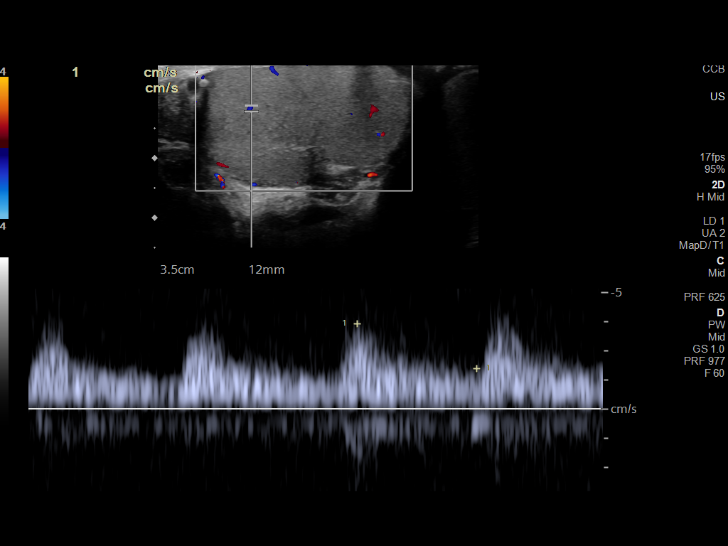
[im 17/66]
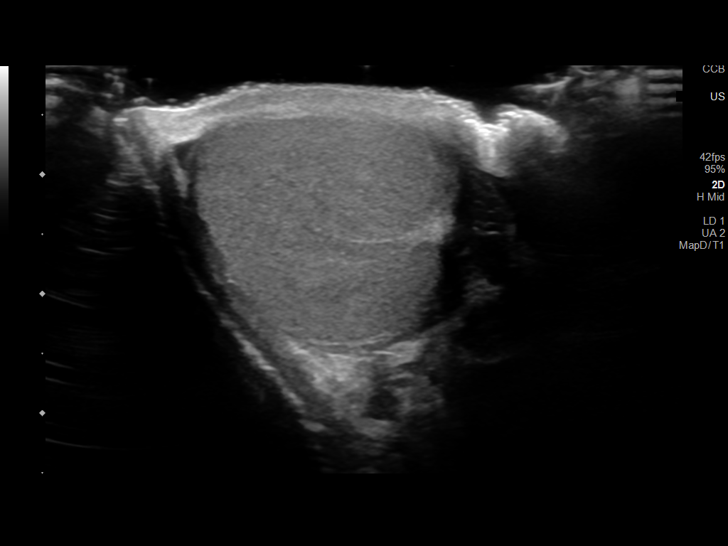
[im 22/66]
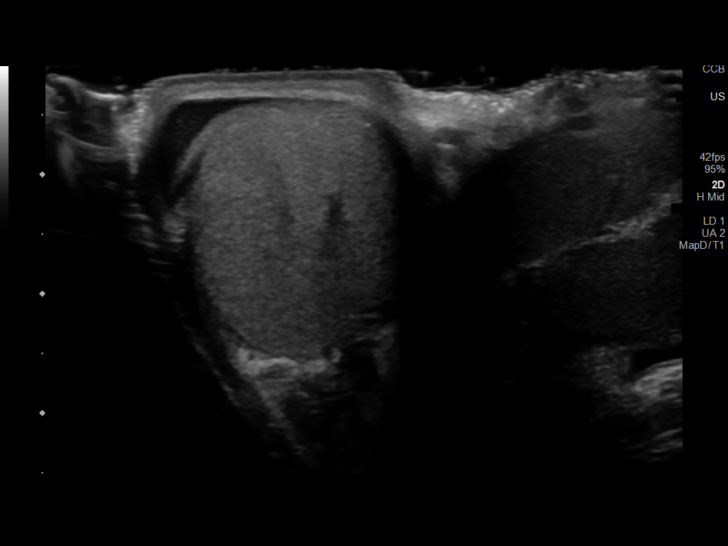
[im 25/66]
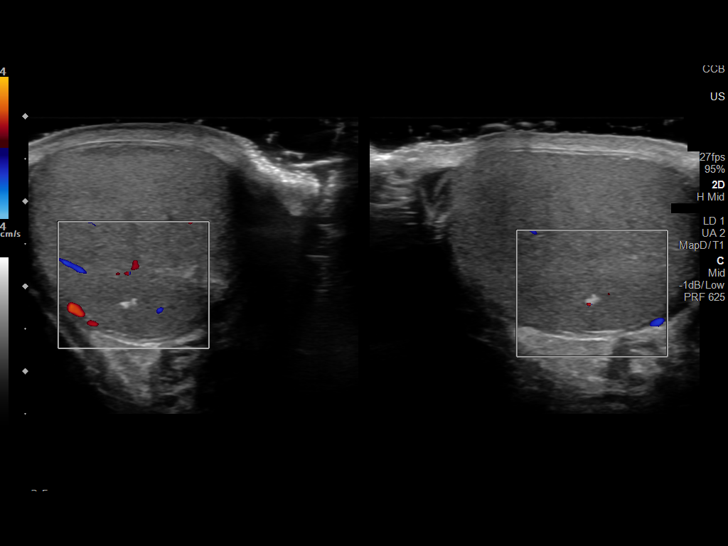
[im 30/66]
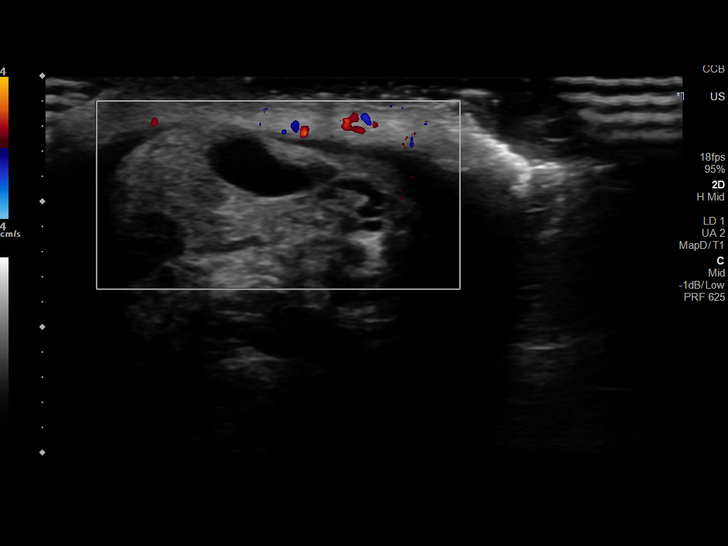
[im 36/66]
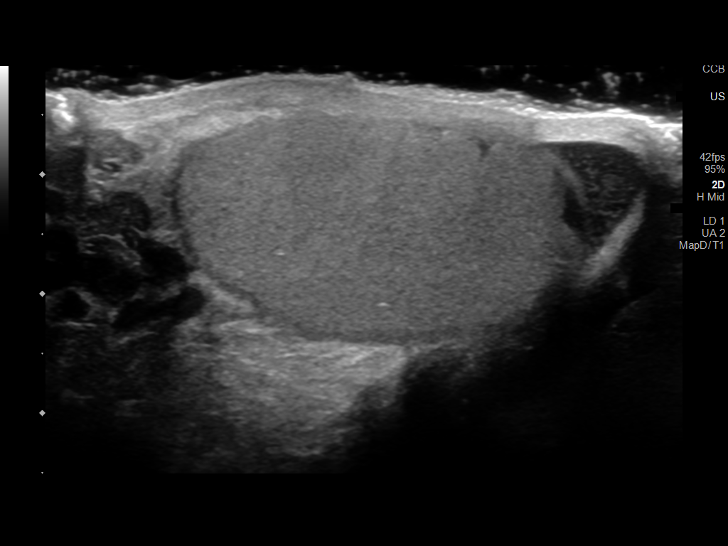
[im 41/66]
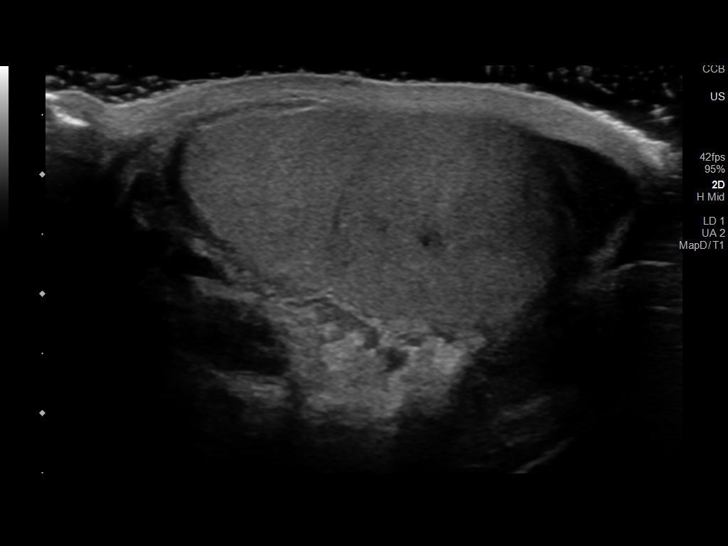
[im 44/66]
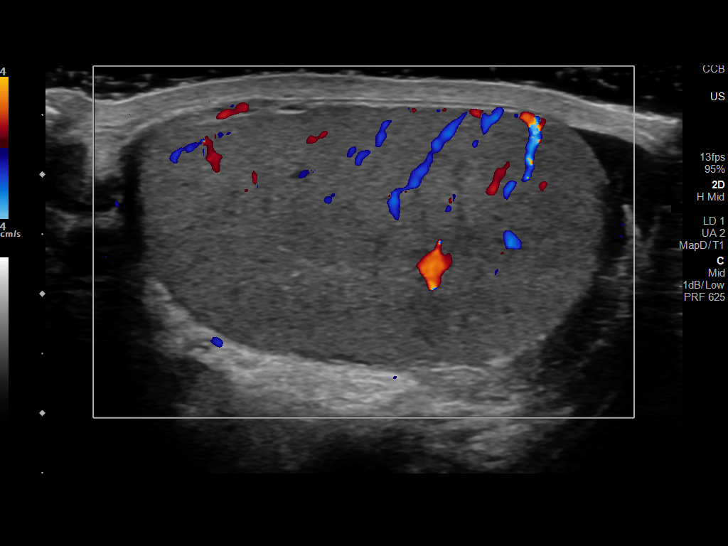
[im 49/66]
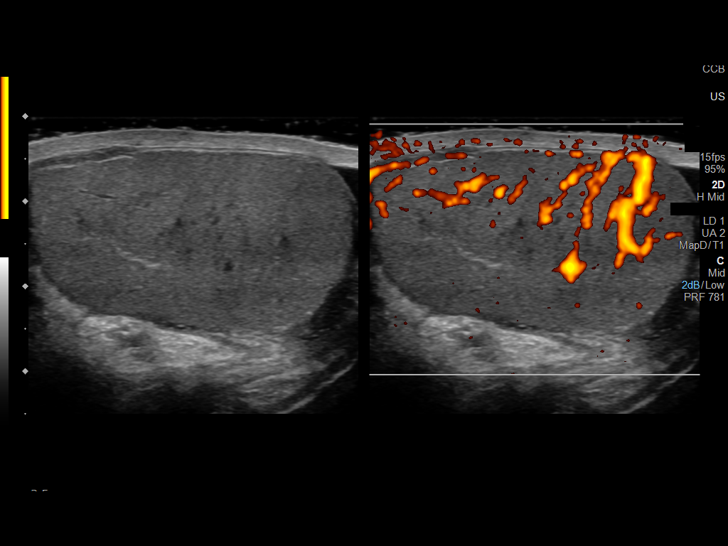
[im 55/66]
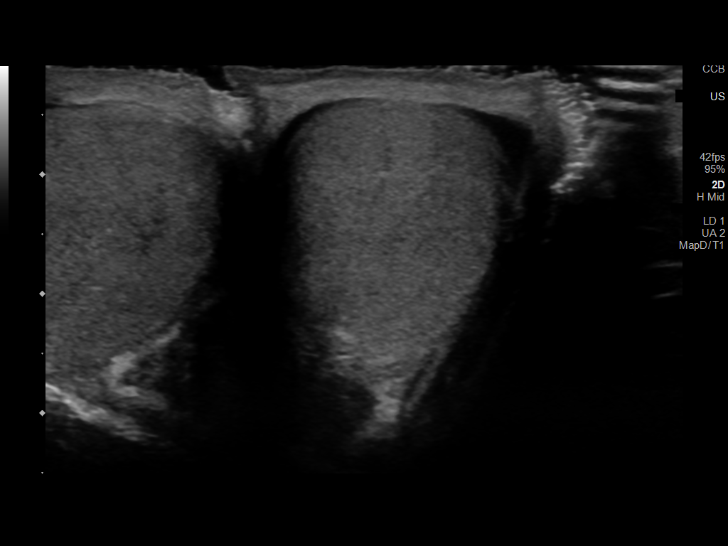
[im 60/66]
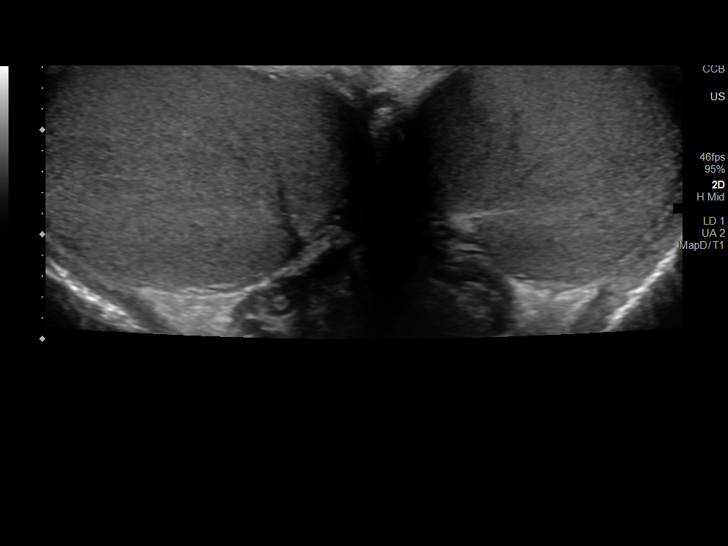
[im 66/66]
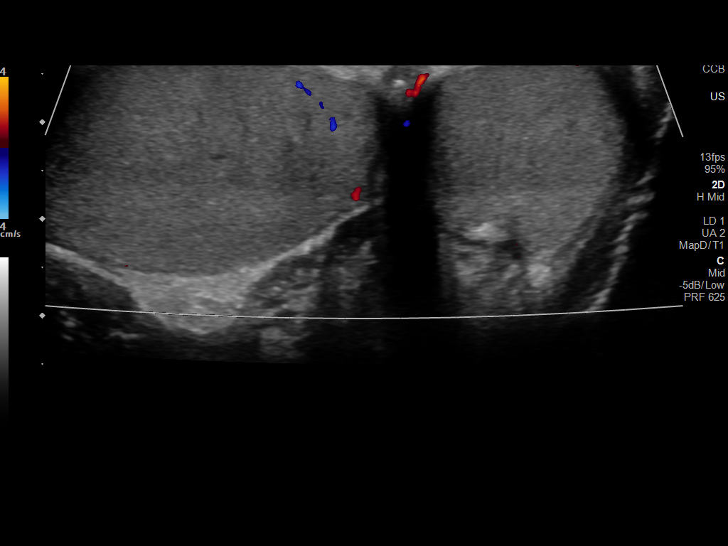

[14 of 25 positions shown; findings below may reference images not displayed]

FINDINGS: Right testicle

Measurements: 3.9 cm x 2.5 cm x 2.6 cm. A 2.3 mm x 1.7 mm
calcification is seen within the right testicle.

Left testicle

Measurements: 3.9 cm x 2.3 cm x 2.4 cm. No mass or microlithiasis
visualized.

Right epididymis: An 8 mm x 5 mm x 5 mm right epididymal head cyst
is seen.

Left epididymis:  The left epididymis is not clearly visualized.

Hydrocele:  Small bilateral hydroceles are seen.

Varicocele:  None visualized.

Pulsed Doppler interrogation of both testes demonstrates normal low
resistance arterial and venous waveforms bilaterally.
IMPRESSION: 1. Small bilateral hydroceles.
2. Subcentimeter right epididymal head cyst.
3. Normal bilateral testicular flow, without evidence of testicular
torsion.

## 2022-05-16 ENCOUNTER — Ambulatory Visit: Payer: Self-pay

## 2022-05-16 NOTE — Telephone Encounter (Signed)
Patient called, left VM to return the call to the office to discuss symptoms with a nurse.    Summary: STD Testing, symptoms   Pt wants to be seen tomorrow for STD testing, please advise. Seeking options, believes he has symptoms

## 2022-05-16 NOTE — Telephone Encounter (Signed)
Summary: STD Testing, symptoms   Pt wants to be seen tomorrow for STD testing, please advise. Seeking options, believes he has symptoms      2nd call attempted to contact patient regarding sx of possible STD. No answer, LVMTCB #[phone].

## 2022-05-16 NOTE — Telephone Encounter (Signed)
Summary: STD Testing, symptoms   Pt wants to be seen tomorrow for STD testing, please advise. Seeking options, believes he has symptoms

## 2022-05-16 NOTE — Telephone Encounter (Signed)
Summary: STD Testing, symptoms   Pt wants to be seen tomorrow for STD testing, please advise. Seeking options, believes he has symptoms      Pt returned our call.   Chief Complaint: STI exposure Symptoms: irritation and neck rash Frequency: 3 weeks Pertinent Negatives: Patient denies  Disposition: [] ED /[x] Urgent Care (no appt availability in office) / [] Appointment(In office/virtual)/ []  [facility]/ [] Home Care/ [] Refused Recommended Disposition /[] [facility]/ []  Follow-up with PCP Additional Notes: Pt 's partner has tested + for Trichomoniasis. Pt wants to be tested for all STI's  Reason for Disposition . Patient is worried they have a sexually transmitted infection (STI)  Answer Assessment - Initial Assessment Questions 1. MAIN CONCERN: "What were you exposed to?"  "What sexually transmitted infection (STI) does your sex partner have?" (e.g., gonorrhea, herpes, HIV, pubic lice)     Partner is positive for trich 2. ROUTE of EXPOSURE: "How were you exposed to the STI?" (e.g., oral, vaginal, or rectal intercourse)     Rash on neck 3. DATE of EXPOSURE: "When did the exposure occur?" (e.g., days)     3 weeks ago 4. SYMPTOMS: "Do you have any symptoms?" (e.g., pain with urination, rash, sores)     Irritation  5. PREGNANCY: "Is there any chance you are pregnant?" "When was your last menstrual period?"     na  Protocols used: STI Exposure-A-AH

## 2022-05-16 NOTE — Telephone Encounter (Signed)
Called pt and LMOM to return call.

## 2022-07-05 ENCOUNTER — Ambulatory Visit (INDEPENDENT_AMBULATORY_CARE_PROVIDER_SITE_OTHER): Payer: Self-pay | Admitting: Primary Care

## 2022-10-06 ENCOUNTER — Ambulatory Visit: Payer: Self-pay | Admitting: Nurse Practitioner
